# Patient Record
Sex: Female | Born: 1995 | Hispanic: Yes | Smoking: Never smoker
Health system: Southern US, Community
[De-identification: ages and names within clinical notes are randomized; demographics above are authoritative.]

## PROBLEM LIST (undated history)

## (undated) ENCOUNTER — Inpatient Hospital Stay (HOSPITAL_COMMUNITY): Payer: Self-pay

## (undated) DIAGNOSIS — F32A Depression, unspecified: Secondary | ICD-10-CM

## (undated) DIAGNOSIS — F419 Anxiety disorder, unspecified: Secondary | ICD-10-CM

## (undated) DIAGNOSIS — F329 Major depressive disorder, single episode, unspecified: Secondary | ICD-10-CM

## (undated) HISTORY — DX: Anxiety disorder, unspecified: F41.9

## (undated) HISTORY — DX: Depression, unspecified: F32.A

## (undated) HISTORY — DX: Major depressive disorder, single episode, unspecified: F32.9

## (undated) HISTORY — PX: NO PAST SURGERIES: SHX2092

---

## 2017-11-17 ENCOUNTER — Encounter (HOSPITAL_COMMUNITY): Payer: Self-pay

## 2017-11-17 ENCOUNTER — Emergency Department (HOSPITAL_COMMUNITY): Payer: Self-pay

## 2017-11-17 DIAGNOSIS — R1110 Vomiting, unspecified: Secondary | ICD-10-CM | POA: Insufficient documentation

## 2017-11-17 DIAGNOSIS — R079 Chest pain, unspecified: Secondary | ICD-10-CM | POA: Insufficient documentation

## 2017-11-17 DIAGNOSIS — R509 Fever, unspecified: Secondary | ICD-10-CM | POA: Insufficient documentation

## 2017-11-17 DIAGNOSIS — R05 Cough: Secondary | ICD-10-CM | POA: Insufficient documentation

## 2017-11-17 DIAGNOSIS — R42 Dizziness and giddiness: Secondary | ICD-10-CM | POA: Insufficient documentation

## 2017-11-17 DIAGNOSIS — Z5321 Procedure and treatment not carried out due to patient leaving prior to being seen by health care provider: Secondary | ICD-10-CM | POA: Insufficient documentation

## 2017-11-17 LAB — BASIC METABOLIC PANEL
ANION GAP: 10 (ref 5–15)
BUN: 9 mg/dL (ref 6–20)
CHLORIDE: 107 mmol/L (ref 101–111)
CO2: 22 mmol/L (ref 22–32)
Calcium: 9.3 mg/dL (ref 8.9–10.3)
Creatinine, Ser: 0.62 mg/dL (ref 0.44–1.00)
GFR calc Af Amer: >60 mL/min (ref >60)
GFR calc non Af Amer: >60 mL/min (ref >60)
Glucose, Bld: 97 mg/dL (ref 65–99)
POTASSIUM: 3.4 mmol/L — AB (ref 3.5–5.1)
SODIUM: 139 mmol/L (ref 135–145)

## 2017-11-17 LAB — CBC
HEMATOCRIT: 41.7 % (ref 36.0–46.0)
HEMOGLOBIN: 13.8 g/dL (ref 12.0–15.0)
MCH: 29.9 pg (ref 26.0–34.0)
MCHC: 33.1 g/dL (ref 30.0–36.0)
MCV: 90.5 fL (ref 78.0–100.0)
Platelets: 289 10*3/uL (ref 150–400)
RBC: 4.61 MIL/uL (ref 3.87–5.11)
RDW: 13.3 % (ref 11.5–15.5)
WBC: 7.3 10*3/uL (ref 4.0–10.5)

## 2017-11-17 LAB — URINALYSIS, ROUTINE W REFLEX MICROSCOPIC
BACTERIA UA: NONE SEEN
BILIRUBIN URINE: NEGATIVE
Glucose, UA: NEGATIVE mg/dL
HGB URINE DIPSTICK: NEGATIVE
KETONES UR: NEGATIVE mg/dL
Nitrite: NEGATIVE
PROTEIN: NEGATIVE mg/dL
SPECIFIC GRAVITY, URINE: 1.024 (ref 1.005–1.030)
pH: 5 (ref 5.0–8.0)

## 2017-11-17 LAB — I-STAT BETA HCG BLOOD, ED (MC, WL, AP ONLY)

## 2017-11-17 LAB — I-STAT TROPONIN, ED: Troponin i, poc: 0 ng/mL (ref 0.00–0.08)

## 2017-11-17 NOTE — ED Triage Notes (Signed)
Pt reports dizziness for the past several days, hot and cold chills, fevers, vomiting x one, and dry cough, chest tightness with coughing

## 2017-11-18 ENCOUNTER — Emergency Department (HOSPITAL_COMMUNITY)
Admission: EM | Admit: 2017-11-18 | Discharge: 2017-11-18 | Disposition: A | Discharge status: Home / Self Care | Payer: Self-pay | Attending: Emergency Medicine | Admitting: Emergency Medicine

## 2017-11-18 NOTE — ED Notes (Addendum)
Patient was called to reassess vital signs patient did not answer.

## 2018-01-29 ENCOUNTER — Ambulatory Visit: Payer: Self-pay | Admitting: Family Medicine

## 2018-01-29 DIAGNOSIS — Z0289 Encounter for other administrative examinations: Secondary | ICD-10-CM

## 2018-02-05 ENCOUNTER — Ambulatory Visit: Payer: 59 | Admitting: Family Medicine

## 2018-02-05 ENCOUNTER — Encounter: Payer: Self-pay | Admitting: Family Medicine

## 2018-02-05 VITALS — BP 96/74 | HR 78 | Temp 97.9°F | Ht 62.0 in | Wt 135.0 lb

## 2018-02-05 DIAGNOSIS — Z7251 High risk heterosexual behavior: Secondary | ICD-10-CM

## 2018-02-05 DIAGNOSIS — Z7689 Persons encountering health services in other specified circumstances: Secondary | ICD-10-CM

## 2018-02-05 DIAGNOSIS — R519 Headache, unspecified: Secondary | ICD-10-CM

## 2018-02-05 DIAGNOSIS — R51 Headache: Secondary | ICD-10-CM | POA: Diagnosis not present

## 2018-02-05 DIAGNOSIS — F329 Major depressive disorder, single episode, unspecified: Secondary | ICD-10-CM

## 2018-02-05 DIAGNOSIS — F32A Depression, unspecified: Secondary | ICD-10-CM

## 2018-02-05 DIAGNOSIS — F419 Anxiety disorder, unspecified: Secondary | ICD-10-CM

## 2018-02-05 LAB — POCT URINE PREGNANCY: PREG TEST UR: NEGATIVE

## 2018-02-05 MED ORDER — SERTRALINE HCL 25 MG PO TABS: ORAL_TABLET | ORAL | 1 refills | Status: DC

## 2018-02-05 NOTE — Progress Notes (Signed)
Patient presents to clinic today to establish care.  SUBJECTIVE: PMH: Patient is a 22 year old female with past medical history significant for depression, and headaches.  Depression/anxiety: -Pt states she is been dealing with this for years. -In the past patient was on citalopram. Pt did not like the way the medication made her feel, so she stopped taking it. -Pt is currently in counseling. -Pt endorses increased tiredness, decreased mood, decreased interest in doing activities. -Pt denies SI/HI. -Pt is interested in restarting medication  Pregnancy concern: -pt endorses concern as her period has been irregular. -pt recently stopped taking Depo provera in April.  She endorses continued bleeding after this for a few weeks, no bleeding x 1 wk, then some light bleeding after that. -pt endorses unprotected sex during this time frame.  HAs: -Patient endorses regular headaches almost daily around lunchtime. -Patient endorses temple and frontal pain accompanied with light sensitivity.  Patient denies aura or blurred vision. -Patient may take Tylenol for this. -Patient drinking maybe 2 bottles of water per day.  Otherwise she is drinking soda at least 3 times per day with each meal.  Allergies: NKDA Lactose intolerance  Past surgical history: With some tooth extraction  Social history: Patient is single.  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  Patient has a high school.  Pt endorses alcohol and marijuana use.  Patient denies tobacco use.   Past Medical History:  Diagnosis Date  . Anxiety   . Depression     History reviewed. No pertinent surgical history.  No current outpatient medications on file prior to visit.   No current facility-administered medications on file prior to visit.     Allergies  Allergen Reactions  . Latex Rash    History reviewed. No pertinent family history.  Social History   Socioeconomic History  .  Marital status: Single    Spouse name: Not on file  . Number of children: Not on file  . Years of education: Not on file  . Highest education level: Not on file  Occupational History  . Not on file  Social Needs  . Financial resource strain: Not on file  . Food insecurity:    Worry: Not on file    Inability: Not on file  . Transportation needs:    Medical: Not on file    Non-medical: Not on file  Tobacco Use  . Smoking status: Never Smoker  . Smokeless tobacco: Never Used  Substance and Sexual Activity  . Alcohol use: Yes    Frequency: Never  . Drug use: Yes    Types: Marijuana  . Sexual activity: Yes  Lifestyle  . Physical activity:    Days per week: Not on file    Minutes per session: Not on file  . Stress: Not on file  Relationships  . Social connections:    Talks on phone: Not on file    Gets together: Not on file    Attends religious service: Not on file    Active member of club or organization: Not on file    Attends meetings of clubs or organizations: Not on file    Relationship status: Not on file  . Intimate partner violence:    Fear of current or ex partner: Not on file    Emotionally abused: Not on file    Physically abused: Not on file    Forced sexual activity: Not on file  Other Topics Concern  . Not  on file  Social History Narrative  . Not on file    ROS General: Denies fever, chills, night sweats, changes in weight, changes in appetite HEENT: Denies headaches, ear pain, changes in vision, rhinorrhea, sore throat CV: Denies CP, palpitations, SOB, orthopnea Pulm: Denies SOB, cough, wheezing GI: Denies abdominal pain, nausea, vomiting, diarrhea, constipation GU: Denies dysuria, hematuria, frequency, vaginal discharge Msk: Denies muscle cramps, joint pains Neuro: Denies weakness, numbness, tingling Skin: Denies rashes, bruising Psych: Denies depression, anxiety, hallucinations  BP 96/74 (BP Location: Left Arm, Patient Position: Sitting, Cuff  Size: Normal)   Pulse 78   Temp 97.9 F (36.6 C) (Oral)   Ht 5' 2"  (1.575 m)   Wt 135 lb (61.2 kg)   SpO2 98%   BMI 24.69 kg/m   Physical Exam Gen. Pleasant, well developed, well-nourished, in NAD HEENT - [place]/AT, PERRL, no scleral icterus, no nasal drainage, pharynx without erythema or exudate.  TMs normal bilaterally.  No cervical lymphadenopathy. Lungs: no use of accessory muscles, CTAB, no wheezes, rales or rhonchi Cardiovascular: RRR, No r/g/m, no peripheral edema Abdomen: BS present, soft, nontender, nondistended Musculoskeletal: No deformities, moves all four extremities, no cyanosis or clubbing, normal tone Neuro:  A&Ox3, CN II-XII intact, normal gait Skin:  Warm, dry, intact, no lesions Psych: normal affect, mood depressed.  Recent Results (from the past 2160 hour(s))  Basic metabolic panel     Status: Abnormal   Collection Time: 11/17/17  9:08 PM  Result Value Ref Range   Sodium 139 135 - 145 mmol/L   Potassium 3.4 (L) 3.5 - 5.1 mmol/L   Chloride 107 101 - 111 mmol/L   CO2 22 22 - 32 mmol/L   Glucose, Bld 97 65 - 99 mg/dL   BUN 9 6 - 20 mg/dL   Creatinine, Ser 0.62 0.44 - 1.00 mg/dL   Calcium 9.3 8.9 - 10.3 mg/dL   GFR calc non Af Amer >60 >60 mL/min   GFR calc Af Amer >60 >60 mL/min    Comment: (NOTE) The eGFR has been calculated using the CKD EPI equation. This calculation has not been validated in all clinical situations. eGFR's persistently <60 mL/min signify possible Chronic Kidney Disease.    Anion gap 10 5 - 15    Comment: Performed at [facility] [address] [place] [address]  CBC     Status: None   Collection Time: 11/17/17  9:08 PM  Result Value Ref Range   WBC 7.3 4.0 - 10.5 K/uL   RBC 4.61 3.87 - 5.11 MIL/uL   Hemoglobin 13.8 12.0 - 15.0 g/dL   HCT 41.7 36.0 - 46.0 %   MCV 90.5 78.0 - 100.0 fL   MCH 29.9 26.0 - 34.0 pg   MCHC 33.1 30.0 - 36.0 g/dL   RDW 13.3 11.5 - 15.5 %   Platelets 289 150 - 400 K/uL    Comment:  Performed at [facility] [address], [place], [place] [address]  Urinalysis, Routine w reflex microscopic     Status: Abnormal   Collection Time: 11/17/17  9:20 PM  Result Value Ref Range   Color, Urine YELLOW YELLOW   APPearance HAZY (A) CLEAR   Specific Gravity, Urine 1.024 1.005 - 1.030   pH 5.0 5.0 - 8.0   Glucose, UA NEGATIVE NEGATIVE mg/dL   Hgb urine dipstick NEGATIVE NEGATIVE   Bilirubin Urine NEGATIVE NEGATIVE   Ketones, ur NEGATIVE NEGATIVE mg/dL   Protein, ur NEGATIVE NEGATIVE mg/dL  Nitrite NEGATIVE NEGATIVE   Leukocytes, UA TRACE (A) NEGATIVE   RBC / HPF 0-5 0 - 5 RBC/hpf   WBC, UA 0-5 0 - 5 WBC/hpf   Bacteria, UA NONE SEEN NONE SEEN   Squamous Epithelial / LPF 0-5 (A) NONE SEEN   Mucus PRESENT     Comment: Performed at [facility], [place] [address], [place], [place] [address]  I-Stat beta hCG blood, ED     Status: None   Collection Time: 11/17/17  9:39 PM  Result Value Ref Range   I-stat hCG, quantitative <5.0 <5 mIU/mL   Comment 3            Comment:   GEST. AGE      CONC.  (mIU/mL)   <=1 WEEK        5 - 50     2 WEEKS       50 - 500     3 WEEKS       100 - 10,000     4 WEEKS     1,000 - 30,000        FEMALE AND NON-PREGNANT FEMALE:     LESS THAN 5 mIU/mL   I-Stat Troponin, ED (not at [facility])     Status: None   Collection Time: 11/17/17  9:39 PM  Result Value Ref Range   Troponin i, poc 0.00 0.00 - 0.08 ng/mL   Comment 3            Comment: Due to the release kinetics of cTnI, a negative result within the first hours of the onset of symptoms does not rule out myocardial infarction with certainty. If myocardial infarction is still suspected, repeat the test at appropriate intervals.     Assessment/Plan: Anxiety and depression  -PHQ 9 score 23 -Gad 7 score 21 -Encouraged to continue counseling - Plan: sertraline (ZOLOFT) 25 MG tablet.  Patient to take 25 mg daily x1 week then may increase to 50 mg -Use of protection during sex advised.   Pt to consider birth control options -Follow-up in 6 weeks, sooner if needed  Frequent headaches -decrease caffeine intake, increase po intake of water -decrease intake of alcohol and marijuana use -get plenty of rest  -given handout  Encounter to establish care -We reviewed the PMH, PSH, FH, SH, Meds and Allergies. -We provided refills for any medications we will prescribe as needed. -We addressed current concerns per orders and patient instructions. -We have asked for records for pertinent exams, studies, vaccines and notes from previous providers. -We have advised patient to follow up per instructions below.  Unprotected sex  - Plan: POCT urine pregnancy  negative -Discussed birth control options.  Patient to decide.  Advised to use condoms in the meantime.  Grier Mitts, MD

## 2018-02-05 NOTE — Patient Instructions (Addendum)
Living With Depression Everyone experiences occasional disappointment, sadness, and loss in their lives. When you are feeling down, blue, or sad for at least 2 weeks in a row, it may mean that you have depression. Depression can affect your thoughts and feelings, relationships, daily activities, and physical health. It is caused by changes in the way your brain functions. If you receive a diagnosis of depression, your health care provider will tell you which type of depression you have and what treatment options are available to you. If you are living with depression, there are ways to help you recover from it and also ways to prevent it from coming back. How to cope with lifestyle changes Coping with stress Stress is your body's reaction to life changes and events, both good and bad. Stressful situations may include:  Getting married.  The death of a spouse.  Losing a job.  Retiring.  Having a baby.  Stress can last just a few hours or it can be ongoing. Stress can play a major role in depression, so it is important to learn both how to cope with stress and how to think about it differently. Talk with your health care provider or a counselor if you would like to learn more about stress reduction. He or she may suggest some stress reduction techniques, such as:  Music therapy. This can include creating music or listening to music. Choose music that you enjoy and that inspires you.  Mindfulness-based meditation. This kind of meditation can be done while sitting or walking. It involves being aware of your normal breaths, rather than trying to control your breathing.  Centering prayer. This is a kind of meditation that involves focusing on a spiritual word or phrase. Choose a word, phrase, or sacred image that is meaningful to you and that brings you peace.  Deep breathing. To do this, expand your stomach and inhale slowly through your nose. Hold your breath for 3-5 seconds, then exhale  slowly, allowing your stomach muscles to relax.  Muscle relaxation. This involves intentionally tensing muscles then relaxing them.  Choose a stress reduction technique that fits your lifestyle and personality. Stress reduction techniques take time and practice to develop. Set aside 5-15 minutes a day to do them. Therapists can offer training in these techniques. The training may be covered by some insurance plans. Other things you can do to manage stress include:  Keeping a stress diary. This can help you learn what triggers your stress and ways to control your response.  Understanding what your limits are and saying no to requests or events that lead to a schedule that is too full.  Thinking about how you respond to certain situations. You may not be able to control everything, but you can control how you react.  Adding humor to your life by watching funny films or TV shows.  Making time for activities that help you relax and not feeling guilty about spending your time this way.  Medicines Your health care provider may suggest certain medicines if he or she feels that they will help improve your condition. Avoid using alcohol and other substances that may prevent your medicines from working properly (may interact). It is also important to:  Talk with your pharmacist or health care provider about all the medicines that you take, their possible side effects, and what medicines are safe to take together.  Make it your goal to take part in all treatment decisions (shared decision-making). This includes giving input on the side   effects of medicines. It is best if shared decision-making with your health care provider is part of your total treatment plan.  If your health care provider prescribes a medicine, you may not notice the full benefits of it for 4-8 weeks. Most people who are treated for depression need to be on medicine for at least 6-12 months after they feel better. If you are taking  medicines as part of your treatment, do not stop taking medicines without first talking to your health care provider. You may need to have the medicine slowly decreased (tapered) over time to decrease the risk of harmful side effects. Relationships Your health care provider may suggest family therapy along with individual therapy and drug therapy. While there may not be family problems that are causing you to feel depressed, it is still important to make sure your family learns as much as they can about your mental health. Having your family's support can help make your treatment successful. How to recognize changes in your condition Everyone has a different response to treatment for depression. Recovery from major depression happens when you have not had signs of major depression for two months. This may mean that you will start to:  Have more interest in doing activities.  Feel less hopeless than you did 2 months ago.  Have more energy.  Overeat less often, or have better or improving appetite.  Have better concentration.  Your health care provider will work with you to decide the next steps in your recovery. It is also important to recognize when your condition is getting worse. Watch for these signs:  Having fatigue or low energy.  Eating too much or too little.  Sleeping too much or too little.  Feeling restless, agitated, or hopeless.  Having trouble concentrating or making decisions.  Having unexplained physical complaints.  Feeling irritable, angry, or aggressive.  Get help as soon as you or your family members notice these symptoms coming back. How to get support and help from others How to talk with friends and family members about your condition Talking to friends and family members about your condition can provide you with one way to get support and guidance. Reach out to trusted friends or family members, explain your symptoms to them, and let them know that you are  working with a health care provider to treat your depression. Financial resources Not all insurance plans cover mental health care, so it is important to check with your insurance carrier. If paying for co-pays or counseling services is a problem, search for a local or county mental health care center. They may be able to offer public mental health care services at low or no cost when you are not able to see a private health care provider. If you are taking medicine for depression, you may be able to get the generic form, which may be less expensive. Some makers of prescription medicines also offer help to patients who cannot afford the medicines they need. Follow these instructions at home:  Get the right amount and quality of sleep.  Cut down on using caffeine, tobacco, alcohol, and other potentially harmful substances.  Try to exercise, such as walking or lifting small weights.  Take over-the-counter and prescription medicines only as told by your health care provider.  Eat a healthy diet that includes plenty of vegetables, fruits, whole grains, low-fat dairy products, and lean protein. Do not eat a lot of foods that are high in solid fats, added sugars, or salt.    Keep all follow-up visits as told by your health care provider. This is important. Contact a health care provider if:  You stop taking your antidepressant medicines, and you have any of these symptoms: ? Nausea. ? Headache. ? Feeling lightheaded. ? Chills and body aches. ? Not being able to sleep (insomnia).  You or your friends and family think your depression is getting worse. Get help right away if:  You have thoughts of hurting yourself or others. If you ever feel like you may hurt yourself or others, or have thoughts about taking your own life, get help right away. You can go to your nearest emergency department or [place]:  Your local emergency services (911 in the [place]  A suicide crisis helpline, such as the  [place] at 603-133-2373. This is open 24-hours a day.  Summary  If you are living with depression, there are ways to help you recover from it and also ways to prevent it from coming back.  Work with your health care team to create a management plan that includes counseling, stress management techniques, and healthy lifestyle habits. This information is not intended to replace advice given to you by your health care provider. Make sure you discuss any questions you have with your health care provider. Document Released: 08/19/2016 Document Revised: 08/19/2016 Document Reviewed: 08/19/2016 Elsevier Interactive Patient Education  2018 [employer] After being diagnosed with an anxiety disorder, you may be relieved to know why you have felt or behaved a certain way. It is natural to also feel overwhelmed about the treatment ahead and what it will mean for your life. With care and support, you can manage this condition and recover from it. How to cope with anxiety Dealing with stress Stress is your body's reaction to life changes and events, both good and bad. Stress can last just a few hours or it can be ongoing. Stress can play a major role in anxiety, so it is important to learn both how to cope with stress and how to think about it differently. Talk with your health care provider or a counselor to learn more about stress reduction. He or she may suggest some stress reduction techniques, such as:  Music therapy. This can include creating or listening to music that you enjoy and that inspires you.  Mindfulness-based meditation. This involves being aware of your normal breaths, rather than trying to control your breathing. It can be done while sitting or walking.  Centering prayer. This is a kind of meditation that involves focusing on a word, phrase, or sacred image that is meaningful to you and that brings you peace.  Deep breathing. To do  this, expand your stomach and inhale slowly through your nose. Hold your breath for 3-5 seconds. Then exhale slowly, allowing your stomach muscles to relax.  Self-talk. This is a skill where you identify thought patterns that lead to anxiety reactions and correct those thoughts.  Muscle relaxation. This involves tensing muscles then relaxing them.  Choose a stress reduction technique that fits your lifestyle and personality. Stress reduction techniques take time and practice. Set aside 5-15 minutes a day to do them. Therapists can offer training in these techniques. The training may be covered by some insurance plans. Other things you can do to manage stress include:  Keeping a stress diary. This can help you learn what triggers your stress and ways to control your response.  Thinking about how you respond to certain situations. You  may not be able to control everything, but you can control your reaction.  Making time for activities that help you relax, and not feeling guilty about spending your time in this way.  Therapy combined with coping and stress-reduction skills provides the best chance for successful treatment. Medicines Medicines can help ease symptoms. Medicines for anxiety include:  Anti-anxiety drugs.  Antidepressants.  Beta-blockers.  Medicines may be used as the main treatment for anxiety disorder, along with therapy, or if other treatments are not working. Medicines should be prescribed by a health care provider. Relationships Relationships can play a big part in helping you recover. Try to spend more time connecting with trusted friends and family members. Consider going to couples counseling, taking family education classes, or going to family therapy. Therapy can help you and others better understand the condition. How to recognize changes in your condition Everyone has a different response to treatment for anxiety. Recovery from anxiety happens when symptoms decrease  and stop interfering with your daily activities at home or work. This may mean that you will start to:  Have better concentration and focus.  Sleep better.  Be less irritable.  Have more energy.  Have improved memory.  It is important to recognize when your condition is getting worse. Contact your health care provider if your symptoms interfere with home or work and you do not feel like your condition is improving. Where to find help and support: You can get help and support from these sources:  [place]-help groups.  Online and [employer].  A trusted spiritual leader.  Couples counseling.  Family education classes.  Family therapy.  Follow these instructions at home:  Eat a healthy diet that includes plenty of vegetables, fruits, whole grains, low-fat dairy products, and lean protein. Do not eat a lot of foods that are high in solid fats, added sugars, or salt.  Exercise. Most adults should do the following: ? Exercise for at least 150 minutes each week. The exercise should increase your heart rate and make you sweat (moderate-intensity exercise). ? Strengthening exercises at least twice a week.  Cut down on caffeine, tobacco, alcohol, and other potentially harmful substances.  Get the right amount and quality of sleep. Most adults need 7-9 hours of sleep each night.  Make choices that simplify your life.  Take over-the-counter and prescription medicines only as told by your health care provider.  Avoid caffeine, alcohol, and certain over-the-counter cold medicines. These may make you feel worse. Ask your pharmacist which medicines to avoid.  Keep all follow-up visits as told by your health care provider. This is important. Questions to ask your health care provider  Would I benefit from therapy?  How often should I follow up with a health care provider?  How long do I need to take medicine?  Are there any long-term side effects of my medicine?  Are  there any alternatives to taking medicine? Contact a health care provider if:  You have a hard time staying focused or finishing daily tasks.  You spend many hours a day feeling worried about everyday life.  You become exhausted by worry.  You start to have headaches, feel tense, or have nausea.  You urinate more than normal.  You have diarrhea. Get help right away if:  You have a racing heart and shortness of breath.  You have thoughts of hurting yourself or others. If you ever feel like you may hurt yourself or others, or have thoughts about taking your own  life, get help right away. You can go to your nearest emergency department or call:  Your local emergency services (911 in the [place]  A suicide crisis helpline, such as the National Suicide Prevention Lifeline at 6294439191. This is open 24-hours a day.  Summary  Taking steps to deal with stress can help calm you.  Medicines cannot cure anxiety disorders, but they can help ease symptoms.  Family, friends, and partners can play a big part in helping you recover from an anxiety disorder. This information is not intended to replace advice given to you by your health care provider. Make sure you discuss any questions you have with your health care provider. Document Released: 09/10/2016 Document Revised: 09/10/2016 Document Reviewed: 09/10/2016 Elsevier Interactive Patient Education  2018 [employer].  Contraception Choices Contraception, also called birth control, means things to use or ways to try not to get pregnant. Hormonal birth control This kind of birth control uses hormones. Here are some types of hormonal birth control:  A tube that is put under skin of the arm (implant). The tube can stay in for as long as 3 years.  Shots to get every 3 months (injections).  Pills to take every day (birth control pills).  A patch to change 1 time each week for 3 weeks (birth control patch). After that, the patch is  taken off for 1 week.  A ring to put in the vagina. The ring is left in for 3 weeks. Then it is taken out of the vagina for 1 week. Then a new ring is put in.  Pills to take after unprotected sex (emergency birth control pills).  Barrier birth control Here are some types of barrier birth control:  A thin covering that is put on the penis before sex (female condom). The covering is thrown away after sex.  A soft, loose covering that is put in the vagina before sex (female condom). The covering is thrown away after sex.  A rubber bowl that sits over the cervix (diaphragm). The bowl must be made for you. The bowl is put into the vagina before sex. The bowl is left in for 6-8 hours after sex. It is taken out within 24 hours.  A small, soft cup that fits over the cervix (cervical cap). The cup must be made for you. The cup can be left in for 6-8 hours after sex. It is taken out within 48 hours.  A sponge that is put into the vagina before sex. It must be left in for at least 6 hours after sex. It must be taken out within 30 hours. Then it is thrown away.  A chemical that kills or stops sperm from getting into the uterus (spermicide). It may be a pill, cream, jelly, or foam to put in the vagina. The chemical should be used at least 10-15 minutes before sex.  IUD (intrauterine) birth control An IUD is a small, T-shaped piece of plastic. It is put inside the uterus. There are two kinds:  Hormone IUD. This kind can stay in for 3-5 years.  Copper IUD. This kind can stay in for 10 years.  Permanent birth control Here are some types of permanent birth control:  Surgery to block the fallopian tubes.  Having an insert put into each fallopian tube.  Surgery to tie off the tubes that carry sperm (vasectomy).  Natural planning birth control Here are some types of natural planning birth control:  Not having sex on the days the woman  could get pregnant.  Using a calendar: ? To keep track of the  length of each period. ? To find out what days pregnancy can happen. ? To plan to not have sex on days when pregnancy can happen.  Watching for symptoms of ovulation and not having sex during ovulation. One way the woman can check for ovulation is to check her temperature.  Waiting to have sex until after ovulation.  Summary  Contraception, also called birth control, means things to use or ways to try not to get pregnant.  Hormonal methods of birth control include implants, injections, pills, patches, vaginal rings, and emergency birth control pills.  Barrier methods of birth control can include female condoms, female condoms, diaphragms, cervical caps, sponges, and spermicides.  There are two types of IUD (intrauterine device) birth control. An IUD can be put in a woman's uterus to prevent pregnancy for 3-5 years.  Permanent sterilization can be done through a procedure for males, females, or both.  Natural planning methods involve not having sex on the days when the woman could get pregnant. This information is not intended to replace advice given to you by your health care provider. Make sure you discuss any questions you have with your health care provider. Document Released: 07/14/2009 Document Revised: 09/26/2016 Document Reviewed: 09/26/2016 Elsevier Interactive Patient Education  2017 [employer].

## 2018-02-07 ENCOUNTER — Encounter: Payer: Self-pay | Admitting: Family Medicine

## 2018-02-09 ENCOUNTER — Ambulatory Visit: Payer: 59 | Admitting: Family Medicine

## 2018-02-09 ENCOUNTER — Encounter: Payer: Self-pay | Admitting: Family Medicine

## 2018-02-09 ENCOUNTER — Other Ambulatory Visit: Payer: Self-pay

## 2018-02-09 VITALS — BP 104/66 | HR 99 | Temp 98.0°F | Resp 17 | Ht 62.0 in | Wt 138.2 lb

## 2018-02-09 DIAGNOSIS — R1084 Generalized abdominal pain: Secondary | ICD-10-CM | POA: Diagnosis not present

## 2018-02-09 DIAGNOSIS — M533 Sacrococcygeal disorders, not elsewhere classified: Secondary | ICD-10-CM | POA: Diagnosis not present

## 2018-02-09 MED ORDER — PANTOPRAZOLE SODIUM 40 MG PO TBEC: 40.0000 mg | DELAYED_RELEASE_TABLET | Freq: Every day | ORAL | 3 refills | Status: DC

## 2018-02-09 NOTE — Progress Notes (Signed)
Subjective:    Patient ID: Sonia Miller, female    DOB: Mar 05, 1996, 22 y.o.   MRN: [id number]  Chief Complaint  Patient presents with  . Abdominal Pain    Painful for about a week, getting worse, severe pain when laying on side or stomach, nausea, sharp pain when walking on right side, worse in afternoon    HPI Patient was seen today for ongoing concern.  Pt endorses abdominal pain with nausea over the last 2 weeks.  Pt cannot associate the pain with specific foods but does notice it when she eats or drinks anything.  Pt also has a feeling of "my organs moving to one side" when she lays on her side at night. Pt also endorses feeling bloated.  Pt endorses occasional burning sensation but denies heartburn.  Pt is having a bowel movement every day, sometimes twice a day.  She denies having to strain.  Pt also endorses a pain in her buttock with prolonged sitting.  Pt states this is become worse over the last month.  Pt does not recall any injury.  The pain is noted as a sharp sensation that is often worse patient has simultaneous abdominal pain.  Pt has tried Tylenol with no relief.  Since last OFV pt has not started the Zoloft as she became anxious thinking about possible side effects.  Past Medical History:  Diagnosis Date  . Anxiety   . Depression     Allergies  Allergen Reactions  . Latex Rash    ROS General: Denies fever, chills, night sweats, changes in weight, changes in appetite HEENT: Denies headaches, ear pain, changes in vision, rhinorrhea, sore throat CV: Denies CP, palpitations, SOB, orthopnea Pulm: Denies SOB, cough, wheezing GI: Denies vomiting, diarrhea, constipation   + abdominal pain, bloating, nausea GU: Denies dysuria, hematuria, frequency, vaginal discharge Msk: Denies muscle cramps, joint pains + back pain Neuro: Denies weakness, numbness, tingling Skin: Denies rashes, bruising Psych: Denies depression, anxiety, hallucinations     Objective:     Blood pressure 104/66, pulse 99, temperature 98 F (36.7 C), temperature source Oral, resp. rate 17, height  (1.575 m), weight 138 lb 3.2 oz (62.7 kg), SpO2 99 %.   Gen. Pleasant, well-nourished, in no distress, normal affect   HEENT: [place]/AT, face symmetric, no scleral icterus, PERRLA, nares patent without drainage, pharynx without erythema or exudate. Lungs: no accessory muscle use, CTAB, no wheezes or rales Cardiovascular: RRR, no m/r/g, no peripheral edema Abdomen: BS present, soft, diffuse TTP greater in RUQ. ND, no hepatosplenomegaly. Musculoskeletal: No deformities, no cyanosis or clubbing, normal tone   TTP of midline sacral area. Neuro:  A&Ox3, CN II-XII intact, normal gait   Wt Readings from Last 3 Encounters:  02/09/18 138 lb 3.2 oz (62.7 kg)  02/05/18 135 lb (61.2 kg)  11/17/17 130 lb (59 kg)    Lab Results  Component Value Date   WBC 7.3 11/17/2017   HGB 13.8 11/17/2017   HCT 41.7 11/17/2017   PLT 289 11/17/2017   GLUCOSE 97 11/17/2017   NA 139 11/17/2017   K 3.4 (L) 11/17/2017   CL 107 11/17/2017   CREATININE 0.62 11/17/2017   BUN 9 11/17/2017   CO2 22 11/17/2017    Assessment/Plan:  Generalized abdominal pain  -Discussed possible causes -We will start PPI daily and obtain labs. -If symptoms continue consider imaging and GI referral. -UA negative.  SG 1.025 -Urine hCG negative at last visit: 02/05/2018 -Patient encouraged to cut down on daily  marijuana use. - Plan: Comprehensive metabolic panel, Lipase, Amylase, CBC with Differential/Platelet, pantoprazole (PROTONIX) 40 MG tablet  Sacral back pain -Discussed massage, stretching, heat or ice. -Okay to take Tylenol as needed  Follow-up PRN in 1 month, sooner if needed  Abbe [place], MD

## 2018-02-09 NOTE — Patient Instructions (Signed)
Abdominal Bloating When you have abdominal bloating, your abdomen may feel full, tight, or painful. It may also look bigger than normal or swollen (distended). Common causes of abdominal bloating include:  Swallowing air.  Constipation.  Problems digesting food.  Eating too much.  Irritable bowel syndrome. This is a condition that affects the large intestine.  Lactose intolerance. This is an inability to digest lactose, a natural sugar in dairy products.  Celiac disease. This is a condition that affects the ability to digest gluten, a protein found in some grains.  Gastroparesis. This is a condition that slows down the movement of food in the stomach and small intestine. It is more common in people with diabetes mellitus.  Gastroesophageal reflux disease (GERD). This is a digestive condition that makes stomach acid flow back into the esophagus.  Urinary retention. This means that the body is holding onto urine, and the bladder cannot be emptied all the way.  Follow these instructions at home: Eating and drinking  Avoid eating too much.  Try not to swallow air while talking or eating.  Avoid eating while lying down.  Avoid these foods and drinks: ? Foods that cause gas, such as broccoli, cabbage, cauliflower, and baked beans. ? Carbonated drinks. ? Hard candy. ? Chewing gum. Medicines  Take over-the-counter and prescription medicines only as told by your health care provider.  Take probiotic medicines. These medicines contain live bacteria or yeasts that can help digestion.  Take coated peppermint oil capsules. Activity  Try to exercise regularly. Exercise may help to relieve bloating that is caused by gas and relieve constipation. General instructions  Keep all follow-up visits as told by your health care provider. This is important. Contact a health care provider if:  You have nausea and vomiting.  You have diarrhea.  You have abdominal pain.  You have  unusual weight loss or weight gain.  You have severe pain, and medicines do not help. Get help right away if:  You have severe chest pain.  You have trouble breathing.  You have shortness of breath.  You have trouble urinating.  You have darker urine than normal.  You have blood in your stools or have dark, tarry stools. Summary  Abdominal bloating means that the abdomen is swollen.  Common causes of abdominal bloating are swallowing air, constipation, and problems digesting food.  Avoid eating too much and avoid swallowing air.  Avoid foods that cause gas, carbonated drinks, hard candy, and chewing gum. This information is not intended to replace advice given to you by your health care provider. Make sure you discuss any questions you have with your health care provider. Document Released: 10/18/2016 Document Revised: 10/18/2016 Document Reviewed: 10/18/2016 Elsevier Interactive Patient Education  2018 Elsevier Inc.  Abdominal Pain, Adult Many things can cause belly (abdominal) pain. Most times, belly pain is not dangerous. Many cases of belly pain can be watched and treated at home. Sometimes belly pain is serious, though. Your doctor will try to find the cause of your belly pain. Follow these instructions at home:  Take over-the-counter and prescription medicines only as told by your doctor. Do not take medicines that help you poop (laxatives) unless told to by your doctor.  Drink enough fluid to keep your pee (urine) clear or pale yellow.  Watch your belly pain for any changes.  Keep all follow-up visits as told by your doctor. This is important. Contact a doctor if:  Your belly pain changes or gets worse.  You are  not hungry, or you lose weight without trying.  You are having trouble pooping (constipated) or have watery poop (diarrhea) for more than 2-3 days.  You have pain when you pee or poop.  Your belly pain wakes you up at night.  Your pain gets worse  with meals, after eating, or with certain foods.  You are throwing up and cannot keep anything down.  You have a fever. Get help right away if:  Your pain does not go away as soon as your doctor says it should.  You cannot stop throwing up.  Your pain is only in areas of your belly, such as the right side or the left lower part of the belly.  You have bloody or black poop, or poop that looks like tar.  You have very bad pain, cramping, or bloating in your belly.  You have signs of not having enough fluid or water in your body (dehydration), such as: ? Dark pee, very little pee, or no pee. ? Cracked lips. ? Dry mouth. ? Sunken eyes. ? Sleepiness. ? Weakness. This information is not intended to replace advice given to you by your health care provider. Make sure you discuss any questions you have with your health care provider. Document Released: 03/04/2008 Document Revised: 04/05/2016 Document Reviewed: 02/28/2016 Elsevier Interactive Patient Education  2018 [employer].

## 2018-02-10 LAB — CBC WITH DIFFERENTIAL/PLATELET
BASOS ABS: 0.1 10*3/uL (ref 0.0–0.1)
Basophils Relative: 0.9 % (ref 0.0–3.0)
Eosinophils Absolute: 0.2 10*3/uL (ref 0.0–0.7)
Eosinophils Relative: 2.7 % (ref 0.0–5.0)
HEMATOCRIT: 39.1 % (ref 36.0–46.0)
HEMOGLOBIN: 13.3 g/dL (ref 12.0–15.0)
LYMPHS PCT: 37.1 % (ref 12.0–46.0)
Lymphs Abs: 2.5 10*3/uL (ref 0.7–4.0)
MCHC: 34 g/dL (ref 30.0–36.0)
MCV: 89.8 fl (ref 78.0–100.0)
MONOS PCT: 6.4 % (ref 3.0–12.0)
Monocytes Absolute: 0.4 10*3/uL (ref 0.1–1.0)
NEUTROS ABS: 3.6 10*3/uL (ref 1.4–7.7)
Neutrophils Relative %: 52.9 % (ref 43.0–77.0)
PLATELETS: 259 10*3/uL (ref 150.0–400.0)
RBC: 4.36 Mil/uL (ref 3.87–5.11)
RDW: 12.9 % (ref 11.5–14.6)
WBC: 6.8 10*3/uL (ref 4.5–10.5)

## 2018-02-10 LAB — COMPREHENSIVE METABOLIC PANEL
ALBUMIN: 4.1 g/dL (ref 3.5–5.2)
ALT: 17 U/L (ref 0–35)
AST: 17 U/L (ref 0–37)
Alkaline Phosphatase: 65 U/L (ref 39–117)
BUN: 10 mg/dL (ref 6–23)
CHLORIDE: 105 meq/L (ref 96–112)
CO2: 28 mEq/L (ref 19–32)
Calcium: 9.3 mg/dL (ref 8.4–10.5)
Creatinine, Ser: 0.62 mg/dL (ref 0.40–1.20)
GFR: 129.21 mL/min (ref >60.00)
Glucose, Bld: 91 mg/dL (ref 70–99)
POTASSIUM: 3.7 meq/L (ref 3.5–5.1)
SODIUM: 141 meq/L (ref 135–145)
Total Bilirubin: 0.3 mg/dL (ref 0.2–1.2)
Total Protein: 6.9 g/dL (ref 6.0–8.3)

## 2018-02-10 LAB — LIPASE: LIPASE: 13 U/L (ref 11.0–59.0)

## 2018-02-10 LAB — AMYLASE: Amylase: 51 U/L (ref 27–131)

## 2018-03-04 ENCOUNTER — Ambulatory Visit: Payer: 59 | Admitting: Family Medicine

## 2018-03-04 DIAGNOSIS — Z0289 Encounter for other administrative examinations: Secondary | ICD-10-CM

## 2018-03-08 ENCOUNTER — Encounter: Payer: Self-pay | Admitting: Family Medicine

## 2018-03-09 ENCOUNTER — Other Ambulatory Visit: Payer: Self-pay | Admitting: Family Medicine

## 2018-03-09 DIAGNOSIS — R1084 Generalized abdominal pain: Secondary | ICD-10-CM

## 2018-03-09 DIAGNOSIS — R110 Nausea: Secondary | ICD-10-CM

## 2018-03-09 MED ORDER — PANTOPRAZOLE SODIUM 40 MG PO TBEC: 40.0000 mg | DELAYED_RELEASE_TABLET | Freq: Two times a day (BID) | ORAL | 3 refills | Status: DC

## 2018-03-10 DIAGNOSIS — R109 Unspecified abdominal pain: Secondary | ICD-10-CM | POA: Diagnosis not present

## 2018-03-10 DIAGNOSIS — R6881 Early satiety: Secondary | ICD-10-CM | POA: Diagnosis not present

## 2018-05-05 DIAGNOSIS — B373 Candidiasis of vulva and vagina: Secondary | ICD-10-CM | POA: Diagnosis not present

## 2018-05-05 DIAGNOSIS — R350 Frequency of micturition: Secondary | ICD-10-CM | POA: Diagnosis not present

## 2018-06-12 DIAGNOSIS — R110 Nausea: Secondary | ICD-10-CM | POA: Diagnosis not present

## 2018-06-30 ENCOUNTER — Ambulatory Visit (HOSPITAL_COMMUNITY)
Admission: EM | Admit: 2018-06-30 | Discharge: 2018-06-30 | Disposition: A | Discharge status: Home / Self Care | Payer: 59 | Attending: Family Medicine | Admitting: Family Medicine

## 2018-06-30 ENCOUNTER — Other Ambulatory Visit: Payer: Self-pay

## 2018-06-30 ENCOUNTER — Encounter (HOSPITAL_COMMUNITY): Payer: Self-pay | Admitting: Emergency Medicine

## 2018-06-30 DIAGNOSIS — R102 Pelvic and perineal pain: Secondary | ICD-10-CM

## 2018-06-30 LAB — POCT URINALYSIS DIP (DEVICE)
Bilirubin Urine: NEGATIVE
GLUCOSE, UA: NEGATIVE mg/dL
Ketones, ur: NEGATIVE mg/dL
LEUKOCYTES UA: NEGATIVE
NITRITE: NEGATIVE
PROTEIN: 30 mg/dL — AB
SPECIFIC GRAVITY, URINE: 1.025 (ref 1.005–1.030)
UROBILINOGEN UA: 1 mg/dL (ref 0.0–1.0)
pH: 7 (ref 5.0–8.0)

## 2018-06-30 LAB — POCT PREGNANCY, URINE: Preg Test, Ur: NEGATIVE

## 2018-06-30 MED ORDER — NAPROXEN 500 MG PO TABS: 500.0000 mg | ORAL_TABLET | Freq: Two times a day (BID) | ORAL | 0 refills | Status: DC
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
## 2018-06-30 NOTE — Discharge Instructions (Addendum)
I believe you are just having a late menstrual period.  Your symptoms should be controlled with the Naprosyn that I prescribed.  If you continue having problems, please return

## 2018-08-14 ENCOUNTER — Encounter (HOSPITAL_COMMUNITY): Payer: Self-pay | Admitting: *Deleted

## 2018-08-14 ENCOUNTER — Encounter (HOSPITAL_COMMUNITY): Payer: Self-pay | Admitting: Emergency Medicine

## 2018-08-14 ENCOUNTER — Other Ambulatory Visit: Payer: Self-pay

## 2018-08-14 ENCOUNTER — Inpatient Hospital Stay (HOSPITAL_COMMUNITY): Payer: 59

## 2018-08-14 ENCOUNTER — Ambulatory Visit (HOSPITAL_COMMUNITY)
Admission: EM | Admit: 2018-08-14 | Discharge: 2018-08-14 | Disposition: A | Discharge status: Home / Self Care | Payer: 59 | Source: Home / Self Care

## 2018-08-14 ENCOUNTER — Inpatient Hospital Stay (HOSPITAL_COMMUNITY)
Admission: AD | Admit: 2018-08-14 | Discharge: 2018-08-14 | Disposition: A | Discharge status: Home / Self Care | Payer: 59 | Source: Ambulatory Visit | Attending: Obstetrics & Gynecology | Admitting: Obstetrics & Gynecology

## 2018-08-14 DIAGNOSIS — O219 Vomiting of pregnancy, unspecified: Secondary | ICD-10-CM | POA: Diagnosis not present

## 2018-08-14 DIAGNOSIS — Z3A01 Less than 8 weeks gestation of pregnancy: Secondary | ICD-10-CM | POA: Diagnosis not present

## 2018-08-14 DIAGNOSIS — O26899 Other specified pregnancy related conditions, unspecified trimester: Secondary | ICD-10-CM

## 2018-08-14 DIAGNOSIS — Z3490 Encounter for supervision of normal pregnancy, unspecified, unspecified trimester: Secondary | ICD-10-CM

## 2018-08-14 DIAGNOSIS — F419 Anxiety disorder, unspecified: Secondary | ICD-10-CM | POA: Diagnosis not present

## 2018-08-14 DIAGNOSIS — Z791 Long term (current) use of non-steroidal anti-inflammatories (NSAID): Secondary | ICD-10-CM | POA: Diagnosis not present

## 2018-08-14 DIAGNOSIS — F329 Major depressive disorder, single episode, unspecified: Secondary | ICD-10-CM | POA: Insufficient documentation

## 2018-08-14 DIAGNOSIS — O26891 Other specified pregnancy related conditions, first trimester: Secondary | ICD-10-CM | POA: Insufficient documentation

## 2018-08-14 DIAGNOSIS — Z3201 Encounter for pregnancy test, result positive: Secondary | ICD-10-CM

## 2018-08-14 DIAGNOSIS — R109 Unspecified abdominal pain: Secondary | ICD-10-CM | POA: Diagnosis not present

## 2018-08-14 LAB — WET PREP, GENITAL
SPERM: NONE SEEN
Trich, Wet Prep: NONE SEEN
Yeast Wet Prep HPF POC: NONE SEEN

## 2018-08-14 LAB — URINALYSIS, ROUTINE W REFLEX MICROSCOPIC
Bilirubin Urine: NEGATIVE
Glucose, UA: NEGATIVE mg/dL
HGB URINE DIPSTICK: NEGATIVE
KETONES UR: 80 mg/dL — AB
NITRITE: NEGATIVE
PROTEIN: NEGATIVE mg/dL
Specific Gravity, Urine: 1.021 (ref 1.005–1.030)
pH: 5 (ref 5.0–8.0)

## 2018-08-14 LAB — CBC
HCT: 37.5 % (ref 36.0–46.0)
Hemoglobin: 12.8 g/dL (ref 12.0–15.0)
MCH: 30 pg (ref 26.0–34.0)
MCHC: 34.1 g/dL (ref 30.0–36.0)
MCV: 88 fL (ref 80.0–100.0)
Platelets: 276 10*3/uL (ref 150–400)
RBC: 4.26 MIL/uL (ref 3.87–5.11)
RDW: 13.3 % (ref 11.5–15.5)
WBC: 8.5 10*3/uL (ref 4.0–10.5)
nRBC: 0 % (ref 0.0–0.2)

## 2018-08-14 LAB — POCT PREGNANCY, URINE
PREG TEST UR: POSITIVE — AB
PREG TEST UR: POSITIVE — AB

## 2018-08-14 LAB — HCG, QUANTITATIVE, PREGNANCY: HCG, BETA CHAIN, QUANT, S: 43642 m[IU]/mL — AB (ref <5)

## 2018-08-14 MED ORDER — PROMETHAZINE HCL 25 MG PO TABS: 12.5000 mg | ORAL_TABLET | Freq: Four times a day (QID) | ORAL | 0 refills | Status: AC | PRN

## 2018-08-14 MED ORDER — PROMETHAZINE HCL 25 MG/ML IJ SOLN
25.0000 mg | Freq: Once | INTRAVENOUS | Status: AC
  Administered 2018-08-14: 25 mg via INTRAVENOUS
  Filled 2018-08-14: qty 1

## 2018-08-14 NOTE — MAU Provider Note (Signed)
History     CSN: [id number]  Arrival date and time: 08/14/18 1807   First Provider Initiated Contact with Patient 08/14/18 2003      Chief Complaint  Patient presents with  . Pelvic Pain  . Emesis  . Vaginal Bleeding   Sonia Miller is a 22 y.o. G1P0 at [redacted]w[redacted]d who presents today with cramping, spotting, headache, nausea and vomiting. She states that she has had pain off and on for about 2 weeks. She has had spotting off and on for a couple of days. She has now also started having nausea and vomiting. She states that she only vomits once per day, but that she has nausea all the time. The nausea prevents her from being able to eat or drink.   Pelvic Pain  The patient's primary symptoms include pelvic pain. The patient's pertinent negatives include no vaginal discharge. This is a new problem. The current episode started in the past 7 days. The problem occurs intermittently. The problem has been gradually worsening. Pain severity now: 8/10. The problem affects both sides. She is pregnant. Associated symptoms include headaches, nausea and vomiting. Pertinent negatives include no chills or fever. The vaginal discharge was bloody. The vaginal bleeding is spotting. Nothing aggravates the symptoms. She has tried nothing for the symptoms.  Emesis   This is a new problem. Episode onset: 2 weeks  The problem occurs less than 2 times per day. The emesis has an appearance of stomach contents. There has been no fever. Associated symptoms include headaches. Pertinent negatives include no chills or fever. Risk factors: pregnancy  She has tried nothing for the symptoms.    OB History    Gravida  1   Para      Term      Preterm      AB      Living        SAB      TAB      Ectopic      Multiple      Live Births              Past Medical History:  Diagnosis Date  . Anxiety   . Depression     Past Surgical History:  Procedure Laterality Date  . NO PAST SURGERIES       Family History  Family history unknown: Yes    Social History   Tobacco Use  . Smoking status: Never Smoker  . Smokeless tobacco: Never Used  Substance Use Topics  . Alcohol use: Not Currently    Frequency: Never  . Drug use: Not Currently    Types: Marijuana    Allergies:  Allergies  Allergen Reactions  . Latex Rash    Medications Prior to Admission  Medication Sig Dispense Refill Last Dose  . naproxen (NAPROSYN) 500 MG tablet Take 1 tablet (500 mg total) by mouth 2 (two) times daily with a meal. 10 tablet 0     Review of Systems  Constitutional: Negative for chills and fever.  Gastrointestinal: Positive for nausea and vomiting.  Genitourinary: Positive for pelvic pain and vaginal bleeding. Negative for vaginal discharge.  Neurological: Positive for headaches.   Physical Exam   Blood pressure 104/68, pulse 84, temperature 98.1 F (36.7 C), temperature source Oral, resp. rate 17, height 5' 0.5" (1.537 m), weight 63.5 kg, last menstrual period 06/28/2018.  Physical Exam  Nursing note and vitals reviewed. Constitutional: She is oriented to person, place, and time. She appears well-developed and  well-nourished. No distress.  HENT:  Head: Normocephalic.  Cardiovascular: Normal rate.  Respiratory: Effort normal.  GI: Soft. There is no tenderness. There is no rebound.  Neurological: She is alert and oriented to person, place, and time.  Skin: Skin is warm and dry.  Psychiatric: She has a normal mood and affect.   Results for orders placed or performed during the hospital encounter of 08/14/18 (from the past 24 hour(s))  Urinalysis, Routine w reflex microscopic     Status: Abnormal   Collection Time: 08/14/18  7:07 PM  Result Value Ref Range   Color, Urine YELLOW YELLOW   APPearance HAZY (A) CLEAR   Specific Gravity, Urine 1.021 1.005 - 1.030   pH 5.0 5.0 - 8.0   Glucose, UA NEGATIVE NEGATIVE mg/dL   Hgb urine dipstick NEGATIVE NEGATIVE   Bilirubin Urine  NEGATIVE NEGATIVE   Ketones, ur 80 (A) NEGATIVE mg/dL   Protein, ur NEGATIVE NEGATIVE mg/dL   Nitrite NEGATIVE NEGATIVE   Leukocytes, UA SMALL (A) NEGATIVE   RBC / HPF 0-5 0 - 5 RBC/hpf   WBC, UA 0-5 0 - 5 WBC/hpf   Bacteria, UA RARE (A) NONE SEEN   Squamous Epithelial / LPF 0-5 0 - 5   Mucus PRESENT   Pregnancy, urine POC     Status: Abnormal   Collection Time: 08/14/18  7:10 PM  Result Value Ref Range   Preg Test, Ur POSITIVE (A) NEGATIVE  CBC     Status: None   Collection Time: 08/14/18  8:03 PM  Result Value Ref Range   WBC 8.5 4.0 - 10.5 K/uL   RBC 4.26 3.87 - 5.11 MIL/uL   Hemoglobin 12.8 12.0 - 15.0 g/dL   HCT 16.137.5 09.636.0 - 04.546.0 %   MCV 88.0 80.0 - 100.0 fL   MCH 30.0 26.0 - 34.0 pg   MCHC 34.1 30.0 - 36.0 g/dL   RDW 40.913.3 81.111.5 - 91.415.5 %   Platelets 276 150 - 400 K/uL   nRBC 0.0 0.0 - 0.2 %  hCG, quantitative, pregnancy     Status: Abnormal   Collection Time: 08/14/18  8:03 PM  Result Value Ref Range   hCG, Beta Chain, Quant, S [phone] (H) <5 mIU/mL  Wet prep, genital     Status: Abnormal   Collection Time: 08/14/18  8:22 PM  Result Value Ref Range   Yeast Wet Prep HPF POC NONE SEEN NONE SEEN   Trich, Wet Prep NONE SEEN NONE SEEN   Clue Cells Wet Prep HPF POC PRESENT (A) NONE SEEN   WBC, Wet Prep HPF POC MODERATE (A) NONE SEEN   Sperm NONE SEEN     MAU Course  Procedures  MDM Patient has had 1L of LR with 25 of phenergan. She reports feeling better, and is tolerating PO.  Assessment and Plan   1. Nausea/vomiting in pregnancy   2. Abdominal pain affecting pregnancy   3. [redacted] weeks gestation of pregnancy   4. Intrauterine pregnancy    DC home Comfort measures reviewed  1st Trimester precautions  Bleeding precautions RX: phenergan PRN #[id number]   Return to MAU as needed FU with OB as planned  Follow-up Information    [facility], [facility].   Contact information: [address] [place] [address] [phone]             Thressa ShellerHeather Hogan 08/14/2018, 8:17 PM

## 2018-08-14 NOTE — MAU Note (Signed)
Pt reports she has been very nausea,, and vomiting a lot. Last pm started having lower back and lower abd pain. Started bleeding 2 days ago, positive home preg on 10/31

## 2018-08-14 NOTE — Progress Notes (Signed)
Pad not working so pt signed hardcopy at d/c home and attached to chart

## 2018-08-14 NOTE — ED Notes (Signed)
[facility]. Pt agreeable to plan, sent to ER.

## 2018-08-14 NOTE — ED Triage Notes (Signed)
Pt reports that she is six weeks pregnant with 5 positive tests at home.  She is here today with two weeks of N&V stating she hasnt been able to keep anything down.  She reports migraine like headaches as well, and spotting two days ago and new onset LLQ pressure that started last night.

## 2018-08-14 NOTE — Progress Notes (Signed)
Heather Hogan CNM in to discuss test results and d/c plan. Written and verbal d/c instructions given and understanding voiced. 

## 2018-08-14 NOTE — Discharge Instructions (Signed)
[facility]  ° ° °[facility]       Phone: [phone] ° °[facility] Phone: [phone] ° °[facility] [place]  Phone: [phone] ° °[facility] [place]  Phone: [phone] ° °[facility]  Phone: [phone] ° °[facility]       Phone: [phone] ° °[facility]    Phone: [phone]  ° °[facility]    Phone: [phone] ° °[facility]    Phone: [phone] ° °[facility]    Phone: [phone] ° °[facility]    Phone: [phone] ° °[facility]       Phone: [phone]  ° °[facility]  Phone: [phone] ° °[facility]    Phone: [phone] ° °[facility]   Phone: [phone] ° °[facility]      Phone: [phone] ° °[facility]    Phone: [phone] ° °Safe Medications in Pregnancy  ° °Acne: °Benzoyl Peroxide °Salicylic Acid ° °Backache/Headache: °Tylenol: 2 regular strength every 4 hours OR °             2 Extra strength every 6 hours ° °Colds/Coughs/Allergies: °Benadryl (alcohol free) 25 mg every 6 hours as needed °Breath right strips °Claritin °Cepacol throat lozenges °Chloraseptic throat spray °Cold-Eeze- up to three times per day °Cough drops, alcohol free °Flonase (by prescription only) °Guaifenesin °Mucinex °Robitussin DM (plain only, alcohol free) °Saline nasal spray/drops °Sudafed (pseudoephedrine) & Actifed ** use only after [redacted] weeks gestation and if you do not have high blood pressure °Tylenol °Vicks Vaporub °Zinc lozenges °Zyrtec  ° °Constipation: °Colace °Ducolax suppositories °Fleet enema °Glycerin suppositories °Metamucil °Milk of  magnesia °Miralax °Senokot °Smooth move tea ° °Diarrhea: °Kaopectate °Imodium A-D ° °*NO pepto Bismol ° °Hemorrhoids: °Anusol °Anusol HC °Preparation H °Tucks ° °Indigestion: °Tums °Maalox °Mylanta °Zantac  °Pepcid ° °Insomnia: °Benadryl (alcohol free) 25mg every 6 hours as needed °Tylenol PM °Unisom, no Gelcaps ° °Leg Cramps: °Tums °MagGel ° °Nausea/Vomiting:  °Bonine °Dramamine °Emetrol °Ginger extract °Sea bands °Meclizine  °Nausea medication to take during pregnancy:  °Unisom (doxylamine succinate 25 mg tablets) Take one tablet daily at bedtime. If symptoms are not adequately controlled, the dose can be increased to a maximum recommended dose of two tablets daily (1/2 tablet in the morning, 1/2 tablet mid-afternoon and one at bedtime). °Vitamin B6 100mg tablets. Take one tablet twice a day (up to 200 mg per day). ° °Skin Rashes: °Aveeno products °Benadryl cream or 25mg every 6 hours as needed °Calamine Lotion °1% cortisone cream ° °Yeast infection: °Gyne-lotrimin 7 °Monistat 7 ° ° °**If taking multiple medications, please check labels to avoid duplicating the same active ingredients °**take medication as directed on the label °** Do not exceed 4000 mg of tylenol in 24 hours °**Do not take medications that contain aspirin or ibuprofen ° ° ° ° °

## 2018-08-15 LAB — ABO/RH: ABO/RH(D): A POS

## 2018-08-17 LAB — GC/CHLAMYDIA PROBE AMP (~~LOC~~) NOT AT ARMC
Chlamydia: NEGATIVE
NEISSERIA GONORRHEA: NEGATIVE

## 2018-08-21 ENCOUNTER — Ambulatory Visit: Payer: 59 | Admitting: Family Medicine

## 2018-08-21 ENCOUNTER — Encounter: Payer: Self-pay | Admitting: Family Medicine

## 2018-08-21 VITALS — BP 98/76 | HR 90 | Temp 98.6°F | Wt 143.0 lb

## 2018-08-21 DIAGNOSIS — R1030 Lower abdominal pain, unspecified: Secondary | ICD-10-CM

## 2018-08-21 DIAGNOSIS — F322 Major depressive disorder, single episode, severe without psychotic features: Secondary | ICD-10-CM | POA: Diagnosis not present

## 2018-08-21 MED ORDER — SERTRALINE HCL 25 MG PO TABS: 25.0000 mg | ORAL_TABLET | Freq: Every day | ORAL | 1 refills | Status: DC

## 2018-08-21 NOTE — Progress Notes (Signed)
Subjective:    Patient ID: Sonia Miller, female    DOB: 04-26-1996, 22 y.o.   MRN: [id number]  No chief complaint on file.   HPI Patient was seen today for acute concern.  Pt is a G1P0010 with pmh sig for anxiety and depression.  Pt s/p surgical abortion at 6wks in [place],Tuesday 11/19.  Pt now with intense abdominal pain with BMs.  Pt notes decreased vaginal bleeding, may use 2 pads/day.  This am pt endorses chills and fever, Tmax 101.  Pt denies n/v, passing clots, HAs, dizziness.  Pt has not taken anything for her symptoms.  Pt also notes feeling depressed given her decision.  Pt states "the timing was bad".  Pt endorses increased crying.  Pt would like to restart medication.  In the past was on zoloft.  Past Medical History:  Diagnosis Date  . Anxiety   . Depression     Allergies  Allergen Reactions  . Latex Rash    ROS General: Denies night sweats, changes in weight, changes in   +fever, chills HEENT: Denies headaches, ear pain, changes in vision, rhinorrhea, sore throat CV: Denies CP, palpitations, SOB, orthopnea Pulm: Denies SOB, cough, wheezing GI: Denies nausea, vomiting, diarrhea, constipation  +abdominal pain GU: Denies dysuria, hematuria, frequency, vaginal discharge  +vaginal bleeding Msk: Denies muscle cramps, joint pains Neuro: Denies weakness, numbness, tingling Skin: Denies rashes, bruising Psych: Denies depression, anxiety, hallucinations    Objective:    Blood pressure 98/76, pulse 90, temperature 98.6 F (37 C), temperature source Oral, weight 143 lb (64.9 kg), last menstrual period 06/28/2018, SpO2 98 %.  Gen. Pleasant, well-nourished, in no distress, tearful   Lungs: no accessory muscle use, CTAB, no wheezes or rales Cardiovascular: RRR, no m/r/g, no peripheral edema Abdomen: BS present, soft, diffuse TTP with pain greatest in RLQ and LLQ, ND. Neuro:  A&Ox3, CN II-XII intact, normal gait Skin:  Warm, no lesions/ rash  Wt Readings  from Last 3 Encounters:  08/21/18 143 lb (64.9 kg)  08/14/18 140 lb 0.6 oz (63.5 kg)  02/09/18 138 lb 3.2 oz (62.7 kg)    Lab Results  Component Value Date   WBC 8.5 08/14/2018   HGB 12.8 08/14/2018   HCT 37.5 08/14/2018   PLT 276 08/14/2018   GLUCOSE 91 02/09/2018   ALT 17 02/09/2018   AST 17 02/09/2018   NA 141 02/09/2018   K 3.7 02/09/2018   CL 105 02/09/2018   CREATININE 0.62 02/09/2018   BUN 10 02/09/2018   CO2 28 02/09/2018    Assessment/Plan:  Lower abdominal pain -given recent procedure, h/o fever, and pain on exam pt [facility]. (labs, imaging, etc) Pt seems hesitant. -expressed concerns for ongoing retained POC/infection.  Pt advised risk could include sepsis and/or death.  Depression, major, single episode, severe (HCC)  -PHQ9 score 25 this visit.  Was 23 on 02/05/18 -will restart Zoloft. -consider counseling. - Plan: sertraline (ZOLOFT) 25 MG tablet  F/u in 2-4 wks, sooner if needed  Abbe AmsterdamShannon Shawntavia Saunders, MD

## 2018-08-22 ENCOUNTER — Encounter (HOSPITAL_COMMUNITY): Payer: Self-pay

## 2018-08-22 ENCOUNTER — Telehealth (HOSPITAL_COMMUNITY): Payer: Self-pay | Admitting: Emergency Medicine

## 2018-08-22 ENCOUNTER — Ambulatory Visit (HOSPITAL_COMMUNITY)
Admission: EM | Admit: 2018-08-22 | Discharge: 2018-08-22 | Disposition: A | Discharge status: Home / Self Care | Payer: 59 | Attending: Family Medicine | Admitting: Family Medicine

## 2018-08-22 DIAGNOSIS — K6289 Other specified diseases of anus and rectum: Secondary | ICD-10-CM

## 2018-08-22 MED ORDER — LIDOCAINE 0.5 % EX GEL: CUTANEOUS | 0 refills | Status: DC

## 2018-08-22 MED ORDER — HYDROCORTISONE 2.5 % RE CREA: TOPICAL_CREAM | RECTAL | 0 refills | Status: DC

## 2018-08-22 NOTE — ED Provider Notes (Signed)
[facility]    CSN: [id number] Arrival date & time: 08/22/18  1333     History   Chief Complaint Chief Complaint  Patient presents with  . Rectal Pain    HPI Sonia Miller is a 22 y.o. female.   22 year old female comes in with 2 day history of rectal pain, slight bleeding with wiping. States was seen by PCP yesterday, who wanted her to go to women[facility] due to recent surgical abortion. Patient states she could not afford an ED visit so did not go. She denies abdominal pain, nausea, vomiting. Denies fever, chills, night sweats. States pain is around rectum, that is significantly worse during bowel movements. Denies constipation. States has daily BMs and has not changed appearance. Denies history of hemorrhoids. Denies melena, hematochezia. Denies swelling, erythema, warmth.      Past Medical History:  Diagnosis Date  . Anxiety   . Depression     There are no active problems to display for this patient.   Past Surgical History:  Procedure Laterality Date  . NO PAST SURGERIES      OB History    Gravida  1   Para      Term      Preterm      AB      Living        SAB      TAB      Ectopic      Multiple      Live Births               Home Medications    Prior to Admission medications   Medication Sig Start Date End Date Taking? Authorizing Provider  hydrocortisone (ANUSOL-HC) 2.5 % rectal cream Apply rectally 2 times daily 08/22/18   Cathie Hoops, Layan Zalenski V, PA-C  Lidocaine 0.5 % GEL 1 application 3-4 times a day. Do not exceed 4 applications a day. 08/22/18   Cathie Hoops, Hamzeh Tall V, PA-C  promethazine (PHENERGAN) 25 MG tablet Take 0.5-1 tablets (12.5-25 mg total) by mouth every 6 (six) hours as needed. 08/14/18   Thressa Sheller D, CNM  sertraline (ZOLOFT) 25 MG tablet Take 1 tablet (25 mg total) by mouth daily. 08/21/18   [name], MD    Family History Family History  Family history unknown: Yes    Social History Social History   Tobacco  Use  . Smoking status: Never Smoker  . Smokeless tobacco: Never Used  Substance Use Topics  . Alcohol use: Not Currently    Frequency: Never  . Drug use: Not Currently    Types: Marijuana     Allergies   Latex   Review of Systems Review of Systems  Reason unable to perform ROS: See HPI as above.     Physical Exam Triage Vital Signs ED Triage Vitals  Enc Vitals Group     BP 08/22/18 1425 117/70     Pulse Rate 08/22/18 1425 81     Resp 08/22/18 1425 20     Temp 08/22/18 1425 97.8 F (36.6 C)     Temp Source 08/22/18 1425 Oral     SpO2 08/22/18 1425 100 %     Weight --      Height --      Head Circumference --      Peak Flow --      Pain Score 08/22/18 1426 8     Pain Loc --      Pain Edu? --  Excl. in GC? --    No data found.  Updated Vital Signs BP 117/70 (BP Location: Right Arm)   Pulse 81   Temp 97.8 F (36.6 C) (Oral)   Resp 20   LMP 06/28/2018   SpO2 100%   Breastfeeding? Unknown   Physical Exam  Constitutional: She is oriented to person, place, and time. She appears well-developed and well-nourished. No distress.  HENT:  Head: Normocephalic and atraumatic.  Eyes: Pupils are equal, round, and reactive to light. Conjunctivae are normal.  Genitourinary: Pelvic exam was performed with patient in the knee-chest position.  Genitourinary Comments: Chaperone present.  No swelling, erythema, warmth.  No external hemorrhoids.  Tender to palpation of the rectum.  Neurological: She is alert and oriented to person, place, and time.  Skin: She is not diaphoretic.     UC Treatments / Results  Labs (all labs ordered are listed, but only abnormal results are displayed) Labs Reviewed - No data to display  EKG None  Radiology No results found.  Procedures Procedures (including critical care time)  Medications Ordered in UC Medications - No data to display  Initial Impression / Assessment and Plan / UC Course  I have reviewed the triage vital  signs and the nursing notes.  Pertinent labs & imaging results that were available during my care of the patient were reviewed by me and considered in my medical decision making (see chart for details).    No perirectal abscess/cellulitis. Will treat for possible anal fissure. Start anusol, lidocaine as directed. Sitz baths. Return precautions given. Patient expresses understanding and agrees to plan.  Final Clinical Impressions(s) / UC Diagnoses   Final diagnoses:  Rectal pain    ED Prescriptions    Medication Sig Dispense Auth. Provider   Lidocaine 0.5 % GEL 1 application 3-4 times a day. Do not exceed 4 applications a day. 170 g Murphy Bundick V, PA-C   hydrocortisone (ANUSOL-HC) 2.5 % rectal cream Apply rectally 2 times daily 28.35 g Threasa AlphaYu, April Colter V, PA-C       Damyon Mullane V, [place]PA-C 08/22/18 1524

## 2018-08-22 NOTE — ED Triage Notes (Signed)
Pt presents with rectal pain and bleeding when having a BM.

## 2018-08-22 NOTE — Discharge Instructions (Signed)
Start lidocaine and anusol as needed. Sitz baths. Monitor for constipation. Keep hydrated, your urine should be clear to pale yellow in color. Follow up with PCP/GI if symptoms not improving.

## 2018-08-22 NOTE — Telephone Encounter (Signed)
Pharmacy called seeking clarification of an order.  Lidocaine 0.5 % gel is not available in gel form.  Dr haglar agreed to lidocaine 2 % gel with instructions as previously written.  No further questions from pharmacy

## 2018-09-28 ENCOUNTER — Other Ambulatory Visit: Payer: Self-pay

## 2018-10-15 IMAGING — DX DG CHEST 2V
2 series · 2 of 2 positions shown · non-contrast
Comparison: None.

CLINICAL DATA: Cough and dizziness for 1 week.

EXAM:
CHEST  2 VIEW

[chest pa]
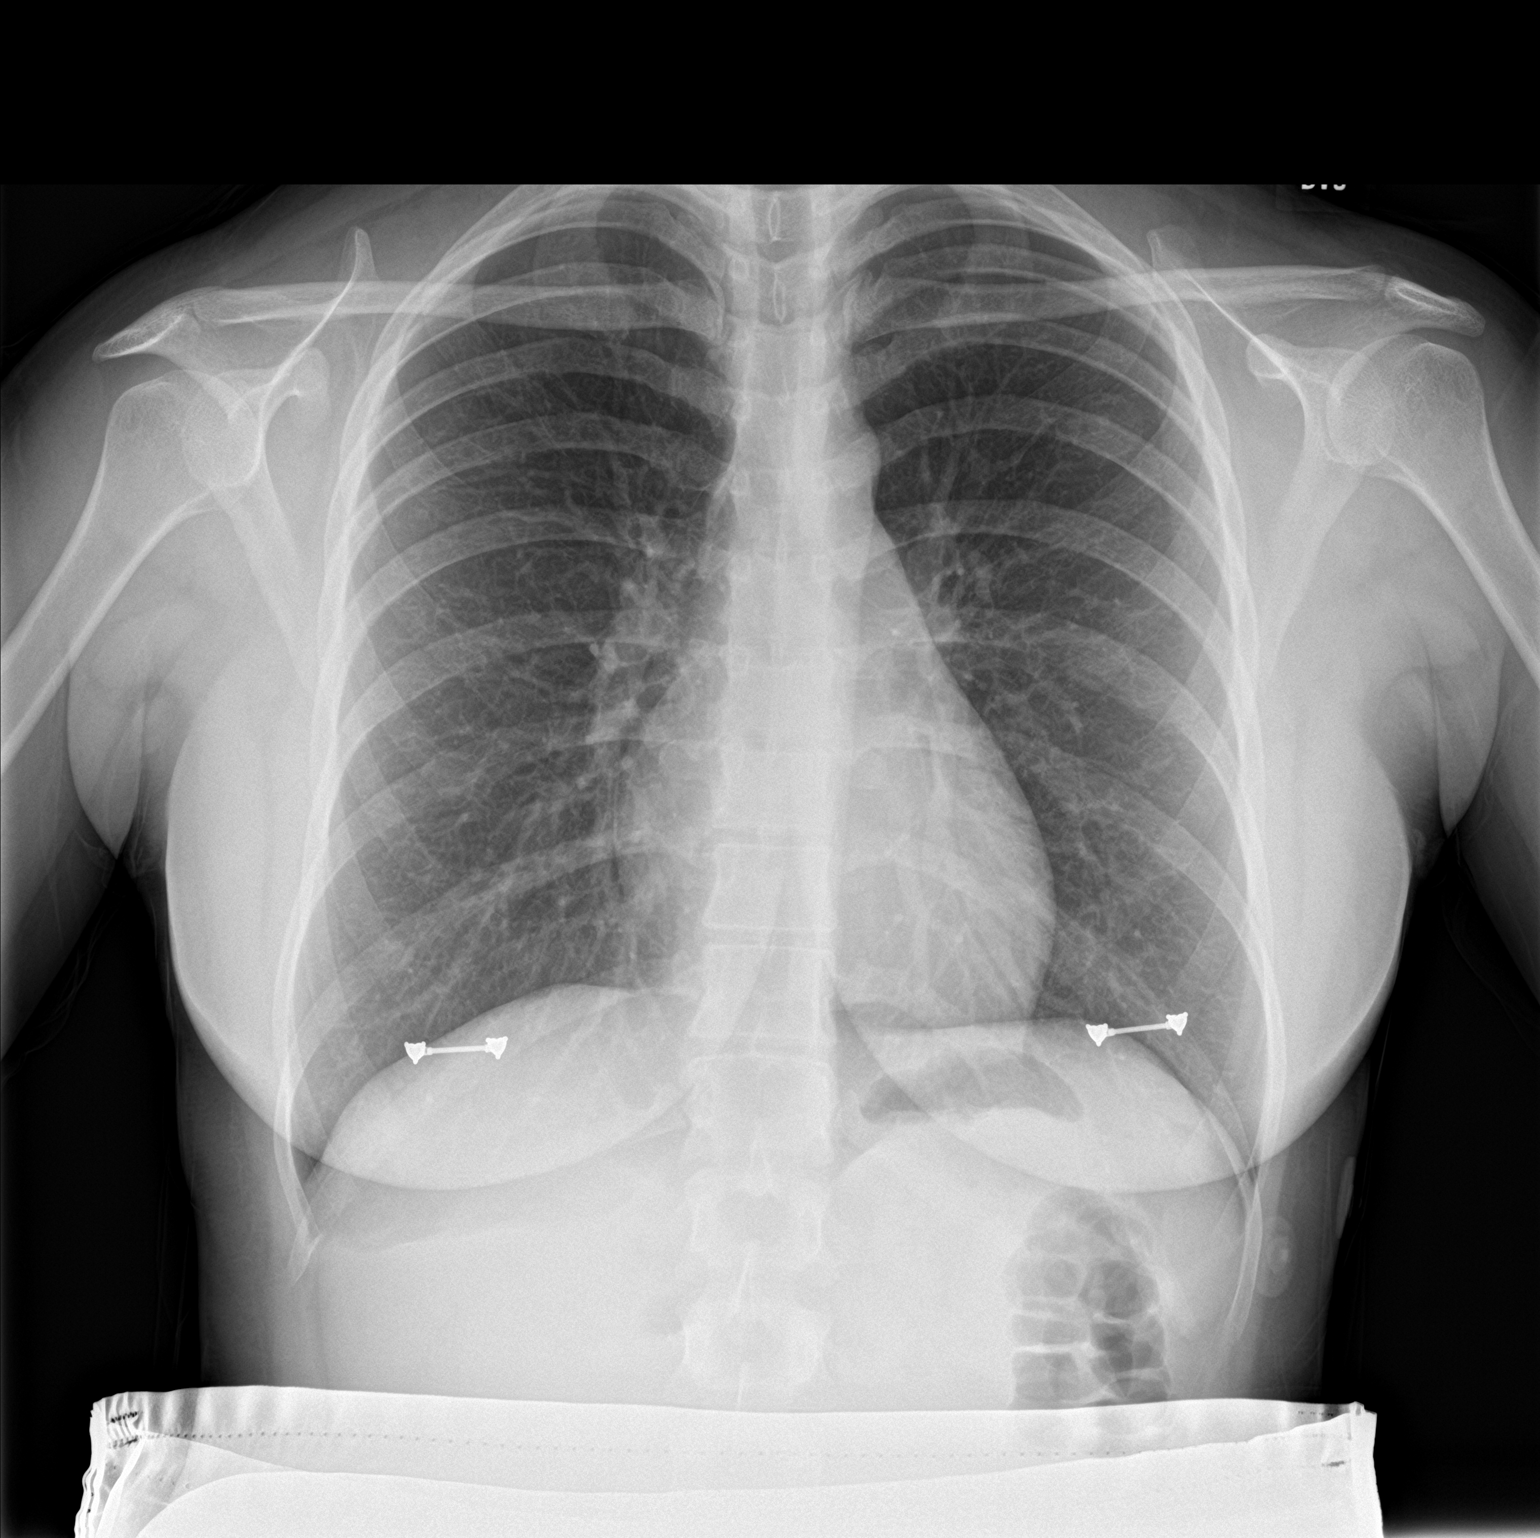

[chest lat]
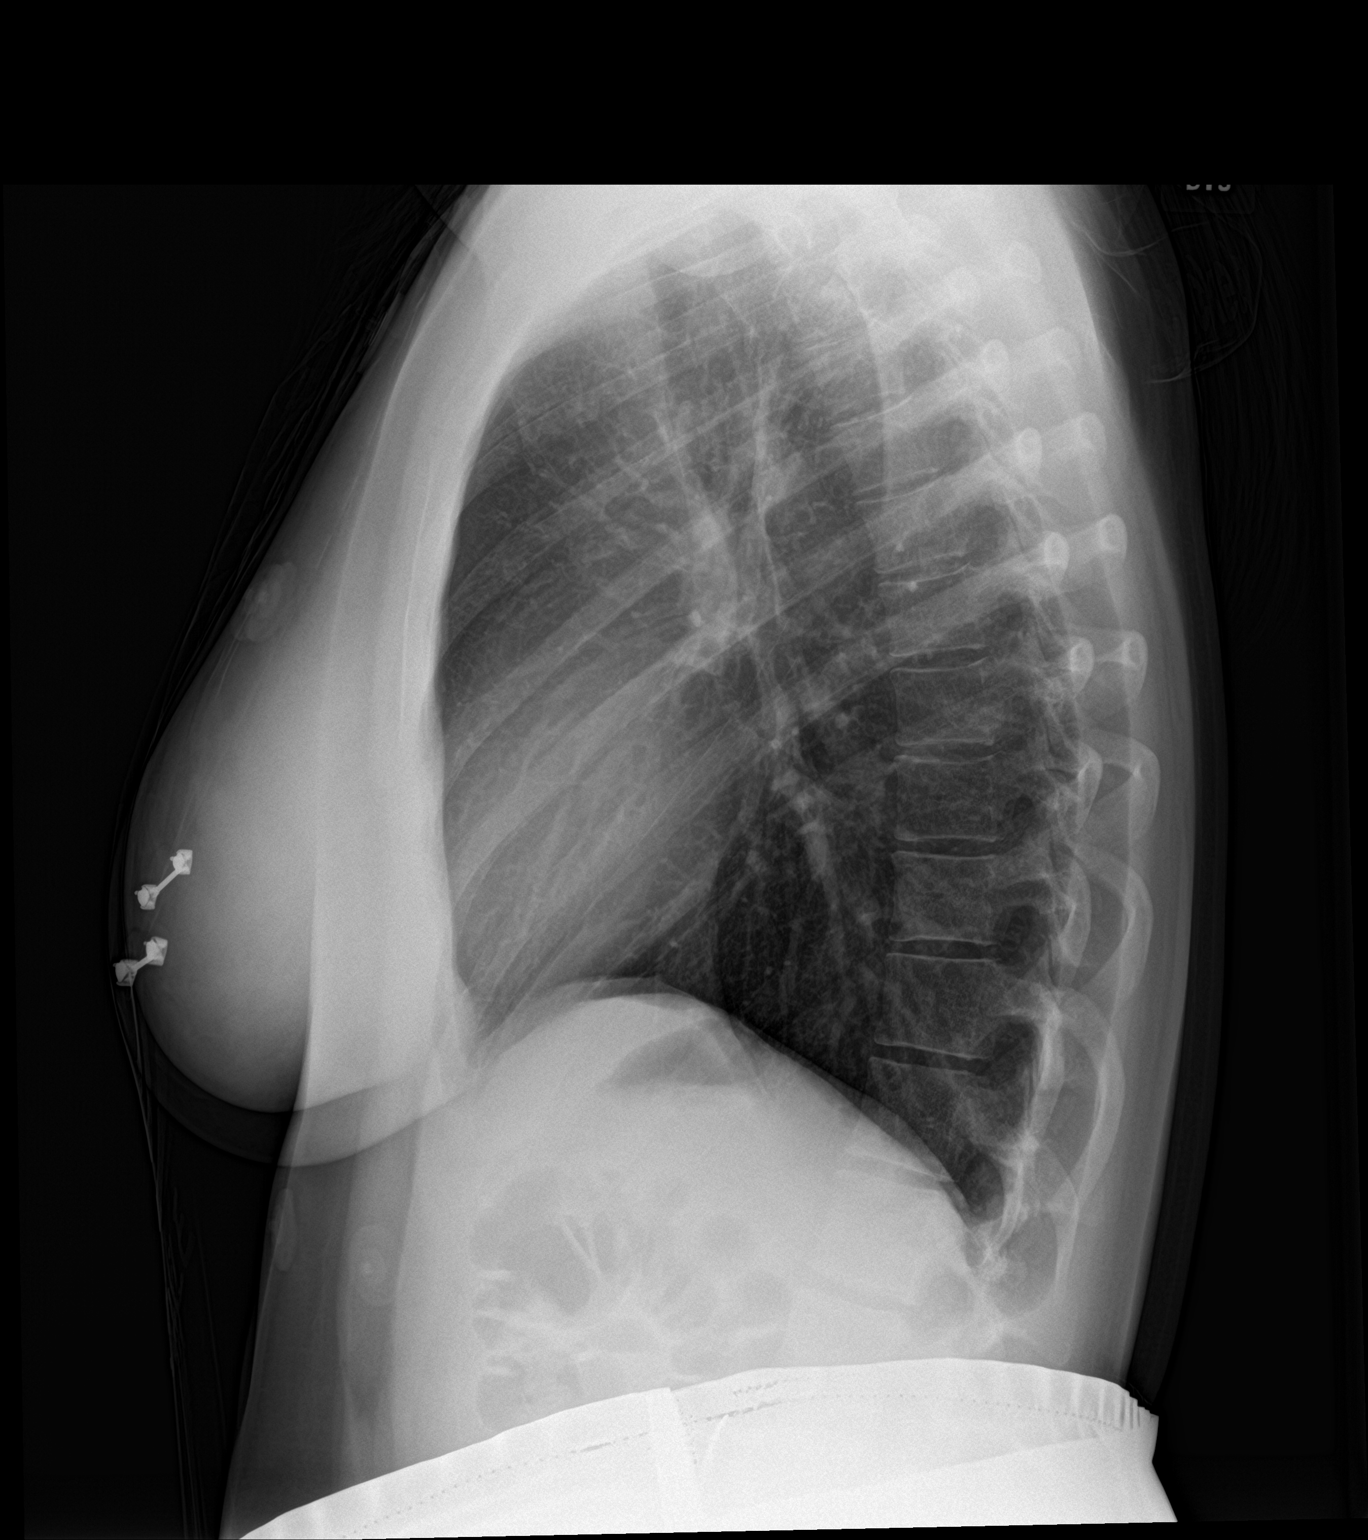

[2 of 2 positions shown; findings below may reference images not displayed]

FINDINGS: The heart size and mediastinal contours are within normal limits.
Both lungs are clear. The visualized skeletal structures are
unremarkable.
IMPRESSION: Normal chest x-ray.

## 2018-12-04 ENCOUNTER — Encounter: Payer: Self-pay | Admitting: Family Medicine

## 2018-12-04 ENCOUNTER — Ambulatory Visit (INDEPENDENT_AMBULATORY_CARE_PROVIDER_SITE_OTHER): Payer: Self-pay | Admitting: Family Medicine

## 2018-12-04 VITALS — BP 104/72 | HR 86 | Temp 98.1°F | Wt 143.0 lb

## 2018-12-04 DIAGNOSIS — F419 Anxiety disorder, unspecified: Secondary | ICD-10-CM

## 2018-12-04 DIAGNOSIS — Z87898 Personal history of other specified conditions: Secondary | ICD-10-CM

## 2018-12-04 DIAGNOSIS — R1084 Generalized abdominal pain: Secondary | ICD-10-CM

## 2018-12-04 DIAGNOSIS — N3000 Acute cystitis without hematuria: Secondary | ICD-10-CM

## 2018-12-04 LAB — POC URINALSYSI DIPSTICK (AUTOMATED)
BILIRUBIN UA: NEGATIVE
GLUCOSE UA: NEGATIVE
KETONES UA: NEGATIVE
NITRITE UA: NEGATIVE
Protein, UA: NEGATIVE
RBC UA: NEGATIVE
Spec Grav, UA: 1.02 (ref 1.010–1.025)
Urobilinogen, UA: 0.2 E.U./dL
pH, UA: 6 (ref 5.0–8.0)

## 2018-12-04 LAB — POCT URINE PREGNANCY: Preg Test, Ur: NEGATIVE

## 2018-12-04 MED ORDER — ONDANSETRON HCL 4 MG PO TABS: 4.0000 mg | ORAL_TABLET | Freq: Three times a day (TID) | ORAL | 0 refills | Status: AC | PRN

## 2018-12-04 MED ORDER — CEPHALEXIN 500 MG PO CAPS: 500.0000 mg | ORAL_CAPSULE | Freq: Two times a day (BID) | ORAL | 0 refills | Status: AC

## 2018-12-04 MED ORDER — SERTRALINE HCL 25 MG PO TABS: 25.0000 mg | ORAL_TABLET | Freq: Every day | ORAL | 5 refills | Status: DC

## 2018-12-04 NOTE — Progress Notes (Signed)
Tried to reach pt via phone at 5:50 pm 5/6, but not answer.  No VM left.  Your urine analysis while in clinic shows signs of possible infection.  A sample of your urine was sent to the lab for urine culture.  A prescription for an antibiotic called Keflex was sent to your pharmacy.  You should take this medication twice a day for the next 7 days to help with a possible urine infection.  We will call you with urine culture results.  Urine pregnancy test was negative.

## 2018-12-04 NOTE — Progress Notes (Signed)
Subjective:    Patient ID: Sonia Miller, female    DOB: November 13, 1995, 23 y.o.   MRN: [id number]  No chief complaint on file. pt accompanied by her boyfriend.  HPI Patient was seen today for ongoing concern.  Pt endorses continued abdominal pain, cramps, and back pain.  Pt states normal pain and cramping can occur at any time.  No a/w food.  Pt denies recent constipation.  Trying to drink more water.  Drinking 1 bottle per day.  Also endorses nausea.  Has a h/o motion sickness in cars unless driving.  Taking phenergan 12.5-25 mg daily.  LMP 10/28/18. Pt states she took a home hCG which was negative.  Pt notes increased anxiety.  Pt states she has been out of Zoloft 25 mg x 1 month.  Pt did not call for refill as she figured she would need to be seen.  Pt states she changed jobs and does not have insurance.   Pt's boyfriend thinks her abd pain was less when on the med.   Past Medical History:  Diagnosis Date  . Anxiety   . Depression     Allergies  Allergen Reactions  . Latex Rash    ROS General: Denies fever, chills, night sweats, changes in weight, changes in appetite HEENT: Denies headaches, ear pain, changes in vision, rhinorrhea, sore throat CV: Denies CP, palpitations, SOB, orthopnea Pulm: Denies SOB, cough, wheezing GI: Denies vomiting, diarrhea, constipation +nausea, abdominal pain GU: Denies dysuria, hematuria, frequency, vaginal discharge Msk: Denies muscle cramps, joint pains  +back pain Neuro: Denies weakness, numbness, tingling Skin: Denies rashes, bruising Psych: Denies depression, hallucinations  +anxiety    Objective:    Blood pressure 104/72, pulse 86, temperature 98.1 F (36.7 C), temperature source Oral, weight 143 lb (64.9 kg), last menstrual period 06/28/2018, SpO2 98 %, unknown if currently breastfeeding.  Gen. Pleasant, well-nourished, in no distress, normal affect   HEENT: [place]/AT, face symmetric, no scleral icterus, PERRLA, nares patent without  drainage, pharynx without erythema or exudate. Lungs: no accessory muscle use, CTAB, no wheezes or rales Cardiovascular: RRR, no m/r/g, no peripheral edema Abdomen: BS present, soft, diffuse TTP, RLQ and LLQ with increased TTP.  ND, no hepatosplenomegaly.   No CVA tenderness. MSK: No TTP of midline spine.  TTP of paraspinal muscles. Neuro:  A&Ox3, CN II-XII intact, normal gait Skin:  Warm, no lesions/ rash  Wt Readings from Last 3 Encounters:  12/04/18 143 lb (64.9 kg)  08/21/18 143 lb (64.9 kg)  08/14/18 140 lb 0.6 oz (63.5 kg)    Lab Results  Component Value Date   WBC 8.5 08/14/2018   HGB 12.8 08/14/2018   HCT 37.5 08/14/2018   PLT 276 08/14/2018   GLUCOSE 91 02/09/2018   ALT 17 02/09/2018   AST 17 02/09/2018   NA 141 02/09/2018   K 3.7 02/09/2018   CL 105 02/09/2018   CREATININE 0.62 02/09/2018   BUN 10 02/09/2018   CO2 28 02/09/2018    Assessment/Plan:  Generalized abdominal pain -discussed various causes including constipation, ovarian cyst, IBS, pregnancy, cholecystitis, etc. -UA with 3+ Leuks, SG 1.020 -declines labs at this time.  History of motion sickness  - Plan: ondansetron (ZOFRAN) 4 MG tablet  Anxiety  -PHQ 9 score 16 -Gad 7 score 16 - Plan: sertraline (ZOLOFT) 25 MG tablet  Acute cystitis without hematuria  -UA with 3+ leuks, SG 1.020 -Rx for Keflex sent to patient's pharmacy -will send for UCx - Plan: Urine Culture, POCT Urinalysis  Dipstick (Automated)  F/u prn  Abbe Amsterdam, MD

## 2018-12-06 LAB — URINE CULTURE
MICRO NUMBER:: 287385
SPECIMEN QUALITY:: ADEQUATE

## 2018-12-16 ENCOUNTER — Encounter: Payer: Self-pay | Admitting: Family Medicine

## 2019-02-18 ENCOUNTER — Encounter: Payer: Self-pay | Admitting: Family Medicine

## 2019-03-11 ENCOUNTER — Other Ambulatory Visit: Payer: Self-pay | Admitting: Family Medicine

## 2019-03-11 DIAGNOSIS — F32A Depression, unspecified: Secondary | ICD-10-CM

## 2019-03-11 DIAGNOSIS — F419 Anxiety disorder, unspecified: Secondary | ICD-10-CM

## 2019-03-11 DIAGNOSIS — F329 Major depressive disorder, single episode, unspecified: Secondary | ICD-10-CM

## 2019-05-06 ENCOUNTER — Ambulatory Visit: Payer: Self-pay | Admitting: Family Medicine

## 2019-07-12 IMAGING — US US OB < 14 WEEKS - US OB TV
1 series · 15 of 28 positions shown · non-contrast
Comparison: None.

CLINICAL DATA: 22 y/o  F; 6 weeks of pain.

EXAM:
OBSTETRIC <14 WK US AND TRANSVAGINAL OB US
TECHNIQUE: Both transabdominal and transvaginal ultrasound examinations were
performed for complete evaluation of the gestation as well as the
maternal uterus, adnexal regions, and pelvic cul-de-sac.
Transvaginal technique was performed to assess early pregnancy.

[Series 1: us ob < 14 weeks - us ob tv · 15 of 38 slices shown]
[im 1/38]
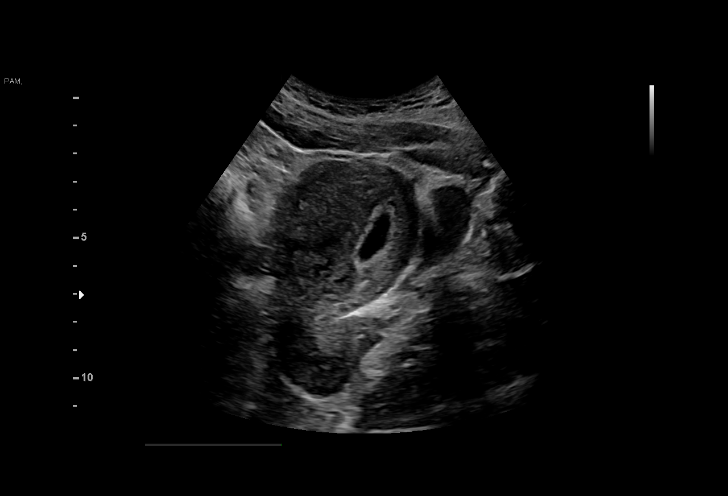
[im 3/38]
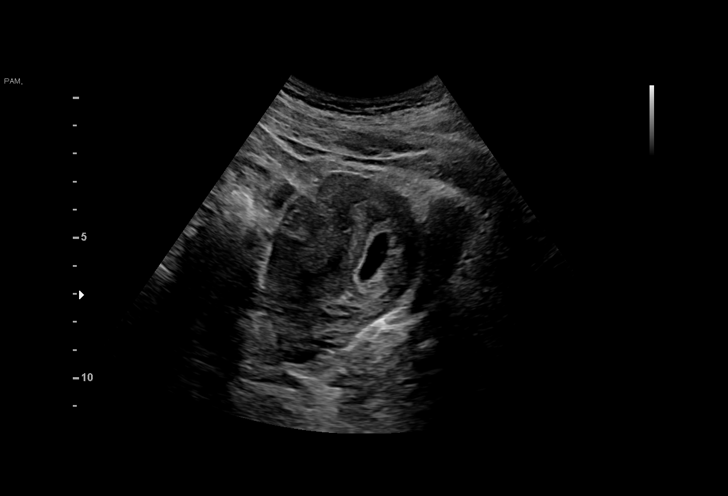
[im 6/38]
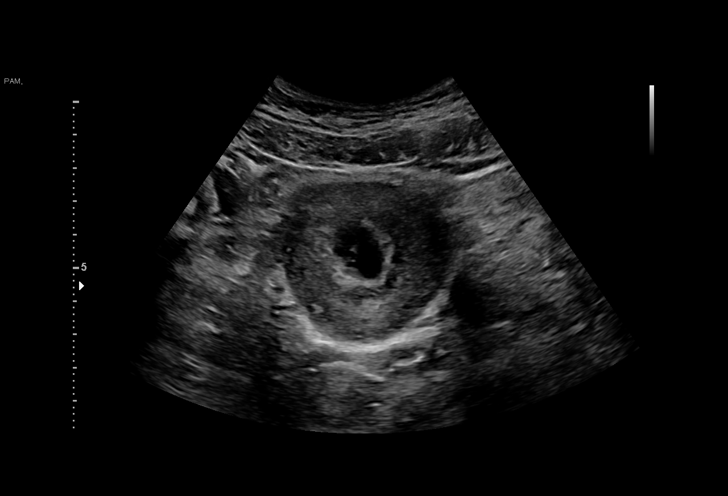
[im 9/38]
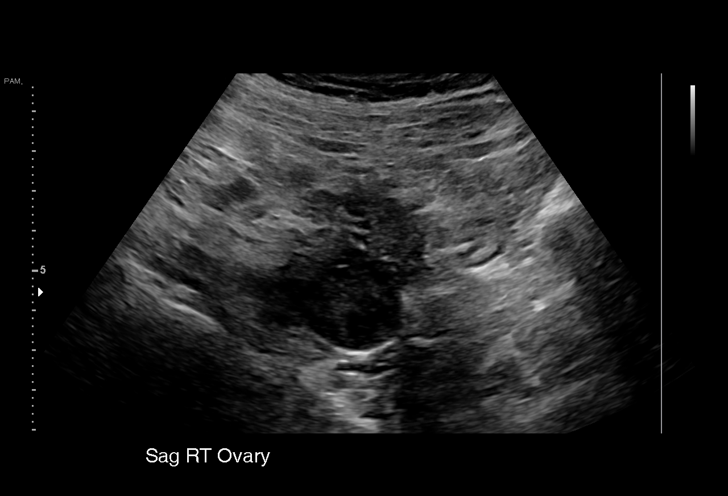
[im 11/38]
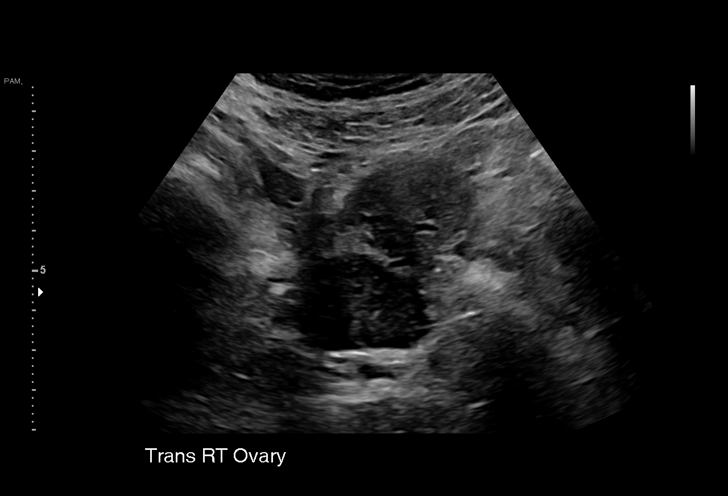
[im 14/38]
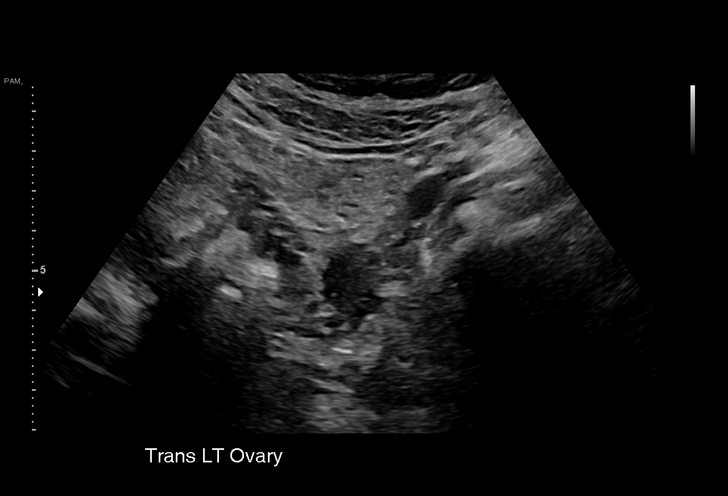
[im 17/38]
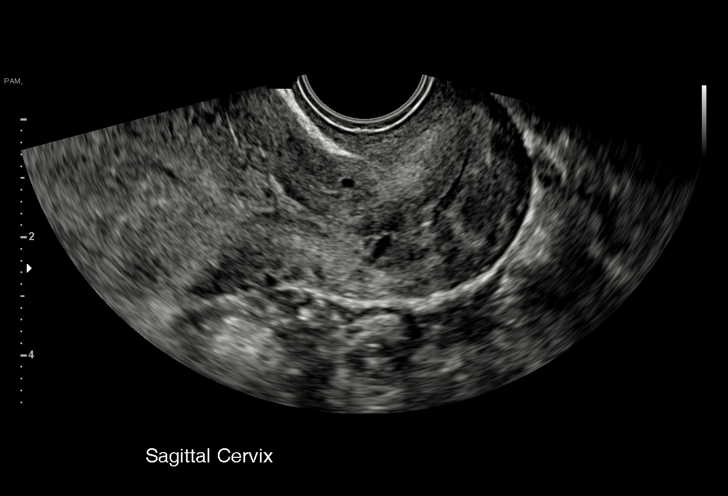
[im 20/38]
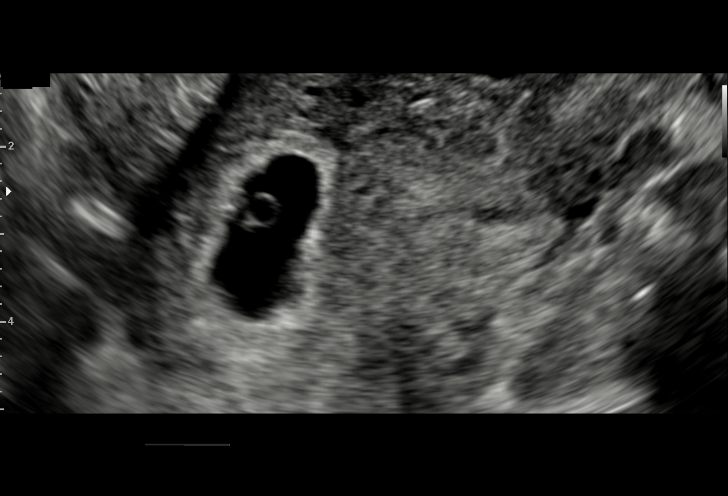
[im 21/38]
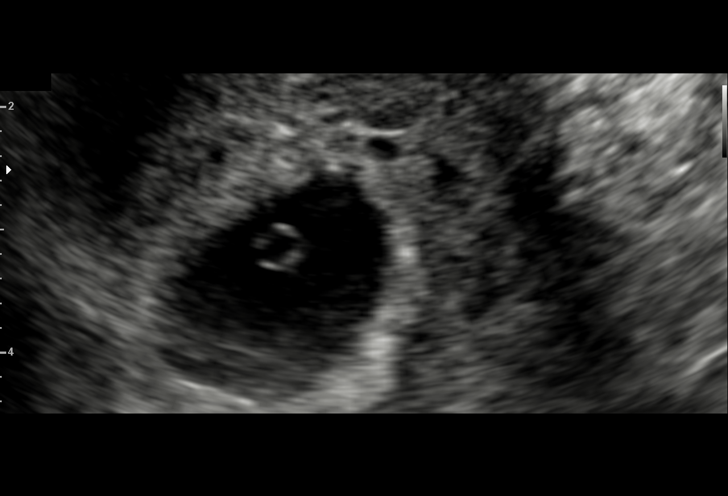
[im 24/38]
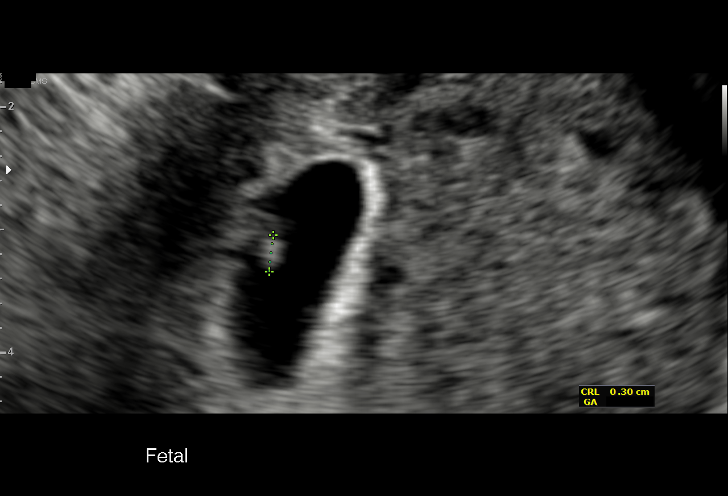
[im 27/38]
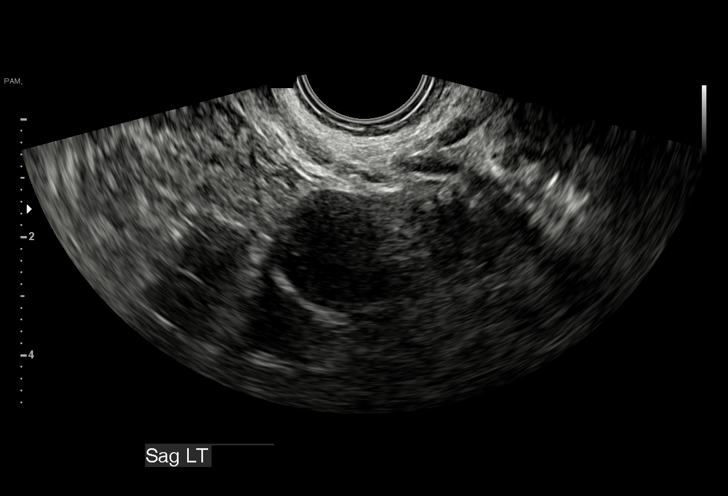
[im 29/38]
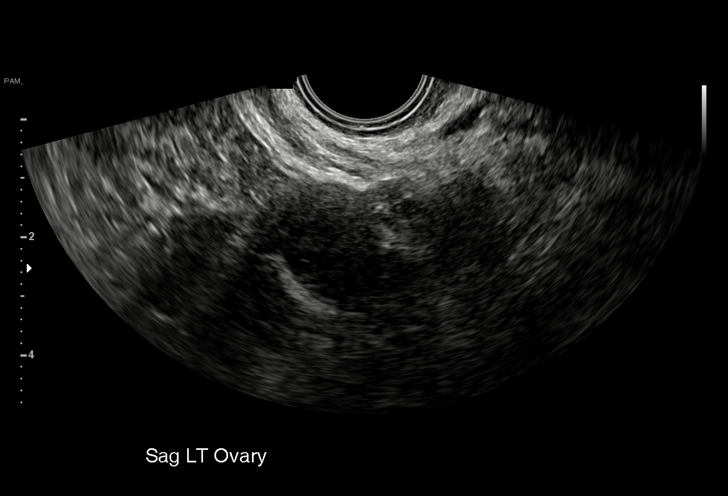
[im 32/38]
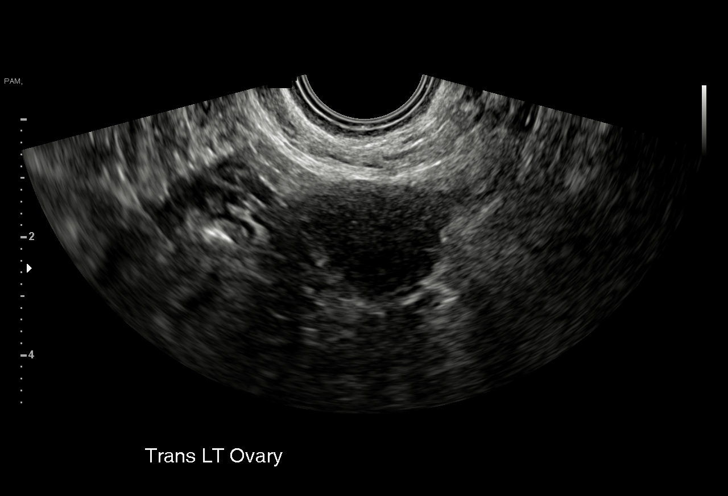
[im 35/38]
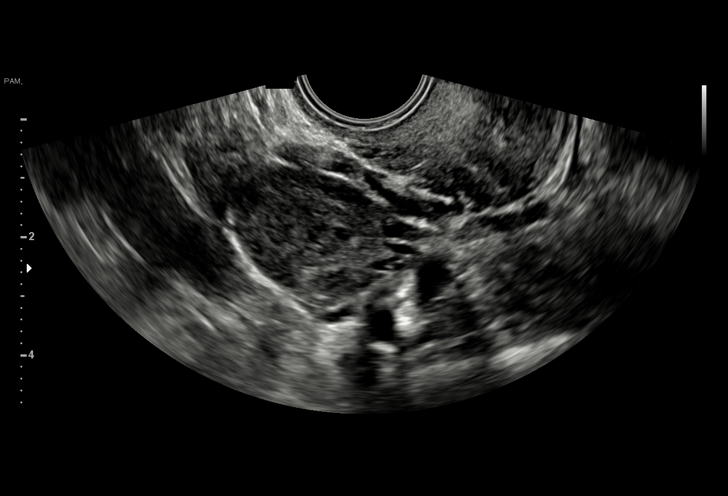
[im 38/38]
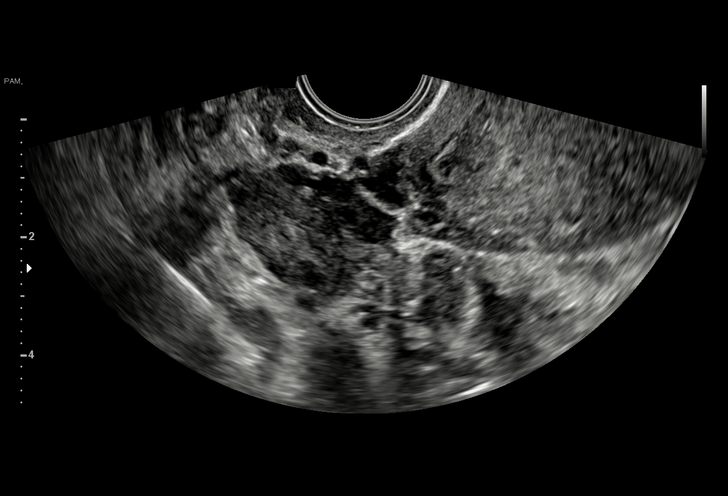

[15 of 28 positions shown; findings below may reference images not displayed]

FINDINGS: Intrauterine gestational sac: Single

Yolk sac:  Visualized.

Embryo:  Visualized.

Cardiac Activity: Visualized.

Heart Rate: 110 bpm

CRL: 3.0 mm   5 w   6 d                  US EDC: 04/10/2019

Subchorionic hemorrhage:  None visualized.

Maternal uterus/adnexae: The right ovary measures 3.4 x 2.2 x
cm. The left ovary measures 2.5 x 2.1 x 1.9 cm. The visible uterus
is unremarkable.
IMPRESSION: Single live intrauterine pregnancy with estimated gestational age of
5 weeks and 6 days. No acute process identified.

## 2019-12-05 ENCOUNTER — Ambulatory Visit: Payer: Self-pay

## 2020-01-21 ENCOUNTER — Other Ambulatory Visit: Payer: Self-pay

## 2020-01-21 ENCOUNTER — Ambulatory Visit (HOSPITAL_COMMUNITY)
Admission: EM | Admit: 2020-01-21 | Discharge: 2020-01-21 | Disposition: A | Discharge status: Home / Self Care | Payer: Self-pay | Attending: Urgent Care | Admitting: Urgent Care

## 2020-01-21 DIAGNOSIS — R42 Dizziness and giddiness: Secondary | ICD-10-CM

## 2020-01-21 DIAGNOSIS — G44209 Tension-type headache, unspecified, not intractable: Secondary | ICD-10-CM

## 2020-01-21 MED ORDER — LEVOCETIRIZINE DIHYDROCHLORIDE 5 MG PO TABS: 5.0000 mg | ORAL_TABLET | Freq: Every evening | ORAL | 0 refills | Status: AC

## 2020-01-21 MED ORDER — NAPROXEN 500 MG PO TABS: 500.0000 mg | ORAL_TABLET | Freq: Two times a day (BID) | ORAL | 0 refills | Status: DC

## 2020-01-21 MED ORDER — MECLIZINE HCL 12.5 MG PO TABS: 12.5000 mg | ORAL_TABLET | Freq: Three times a day (TID) | ORAL | 0 refills | Status: AC | PRN

## 2020-01-21 NOTE — ED Provider Notes (Signed)
[facility]   MRN: [id number] DOB: 23-Jan-1996  Subjective:   Sonia Miller is a 24 y.o. female presenting for 1 week hx of acute onset worsening dizziness. Initially was intermittent but is now constant. Has had slight blurred vision. Has developed frontal headache, intermittently over back of head and upper part of neck.  Recent labs show patient has not had diabetes, anemia.  Patient also denies family history of the same.  Patient hydrates very well, eats regular meals.  Denies drug use.  Denies significant stressors. LMP was last week.   No current facility-administered medications for this encounter.  Current Outpatient Medications:  .  hydrocortisone (ANUSOL-HC) 2.5 % rectal cream, Apply rectally 2 times daily, Disp: 28.35 g, Rfl: 0 .  levocetirizine (XYZAL) 5 MG tablet, Take 1 tablet (5 mg total) by mouth every evening., Disp: 30 tablet, Rfl: 0 .  Lidocaine 0.5 % GEL, 1 application 3-4 times a day. Do not exceed 4 applications a day., Disp: 170 g, Rfl: 0 .  meclizine (ANTIVERT) 12.5 MG tablet, Take 1 tablet (12.5 mg total) by mouth 3 (three) times daily as needed for dizziness., Disp: 30 tablet, Rfl: 0 .  naproxen (NAPROSYN) 500 MG tablet, Take 1 tablet (500 mg total) by mouth 2 (two) times daily., Disp: 30 tablet, Rfl: 0 .  naproxen (NAPROSYN) 500 MG tablet, Take 1 tablet (500 mg total) by mouth 2 (two) times daily., Disp: 30 tablet, Rfl: 0 .  ondansetron (ZOFRAN) 4 MG tablet, Take 1 tablet (4 mg total) by mouth every 8 (eight) hours as needed for nausea or vomiting., Disp: 20 tablet, Rfl: 0 .  promethazine (PHENERGAN) 25 MG tablet, Take 0.5-1 tablets (12.5-25 mg total) by mouth every 6 (six) hours as needed., Disp: 30 tablet, Rfl: 0 .  sertraline (ZOLOFT) 25 MG tablet, Take 1 tablet (25 mg total) by mouth daily. (Patient taking differently: Take 25 mg by mouth daily. Pt not taking), Disp: 30 tablet, Rfl: 1 .  sertraline (ZOLOFT) 25 MG tablet, Take 1 tablet (25 mg  total) by mouth daily. (Patient taking differently: Take 25 mg by mouth daily. Pt not taking), Disp: 30 tablet, Rfl: 5   Allergies  Allergen Reactions  . Latex Rash    Past Medical History:  Diagnosis Date  . Anxiety   . Depression      Past Surgical History:  Procedure Laterality Date  . NO PAST SURGERIES      Family History  Family history unknown: Yes    Social History   Tobacco Use  . Smoking status: Never Smoker  . Smokeless tobacco: Never Used  Substance Use Topics  . Alcohol use: Not Currently  . Drug use: Not Currently    Types: Marijuana    Review of Systems  Constitutional: Negative for fever and malaise/fatigue.  HENT: Negative for congestion, ear discharge, ear pain, hearing loss, sinus pain, sore throat and tinnitus.   Eyes: Positive for blurred vision. Negative for double vision, discharge and redness.  Respiratory: Negative for cough, hemoptysis, shortness of breath and wheezing.   Cardiovascular: Negative for chest pain.  Gastrointestinal: Negative for abdominal pain, diarrhea, nausea and vomiting.  Genitourinary: Negative for dysuria, flank pain and hematuria.  Musculoskeletal: Negative for myalgias and neck pain.  Skin: Negative for itching and rash.  Neurological: Positive for dizziness and headaches. Negative for weakness.  Psychiatric/Behavioral: Negative for depression and substance abuse.     Objective:   Vitals: BP 134/78   Pulse 66  Temp 98.1 F (36.7 C)   Resp 16   LMP 01/14/2020   SpO2 99%   Physical Exam Constitutional:      General: She is not in acute distress.    Appearance: Normal appearance. She is well-developed. She is not ill-appearing, toxic-appearing or diaphoretic.  HENT:     Head: Normocephalic and atraumatic.     Right Ear: Tympanic membrane and ear canal normal. No drainage or tenderness. No middle ear effusion. Tympanic membrane is not erythematous.     Left Ear: Tympanic membrane and ear canal normal. No  drainage or tenderness.  No middle ear effusion. Tympanic membrane is not erythematous.     Nose: Nose normal. No congestion or rhinorrhea.     Mouth/Throat:     Mouth: Mucous membranes are moist. No oral lesions.     Pharynx: Oropharynx is clear. No pharyngeal swelling, oropharyngeal exudate, posterior oropharyngeal erythema or uvula swelling.     Tonsils: No tonsillar exudate or tonsillar abscesses.  Eyes:     General: No scleral icterus.       Right eye: No discharge.        Left eye: No discharge.     Extraocular Movements: Extraocular movements intact.     Right eye: Normal extraocular motion.     Left eye: Normal extraocular motion.     Conjunctiva/sclera: Conjunctivae normal.     Pupils: Pupils are equal, round, and reactive to light.  Cardiovascular:     Rate and Rhythm: Normal rate and regular rhythm.     Pulses: Normal pulses.     Heart sounds: Normal heart sounds. No murmur. No friction rub. No gallop.   Pulmonary:     Effort: Pulmonary effort is normal. No respiratory distress.     Breath sounds: Normal breath sounds. No stridor. No wheezing, rhonchi or rales.  Musculoskeletal:     Cervical back: Normal range of motion and neck supple.  Lymphadenopathy:     Cervical: No cervical adenopathy.  Skin:    General: Skin is warm and dry.     Findings: No rash.  Neurological:     General: No focal deficit present.     Mental Status: She is alert and oriented to person, place, and time.     Cranial Nerves: No cranial nerve deficit.     Motor: No weakness.     Coordination: Coordination normal.     Gait: Gait normal.     Deep Tendon Reflexes: Reflexes normal.  Psychiatric:        Mood and Affect: Mood normal.        Behavior: Behavior normal.        Thought Content: Thought content normal.        Judgment: Judgment normal.      Assessment and Plan :   PDMP not reviewed this encounter.  1. Dizziness   2. Tension headache     Physical exam findings and vital  signs very reassuring.  Lab review shows negative diabetes and anemia.  Patient does not describe a vertigo type sensation and therefore lower suspicion for BPPV. Low suspicion for COVID. Offered patient meclizine for dizziness, naproxen for headaches, trial of antihistamine. If no improvement by Monday, consider amoxicillin to address sinus infection. Consider COVID 19 check, pregnancy test (not done today as LMP was last week).  Counseled patient on potential for adverse effects with medications prescribed/recommended today, ER and return-to-clinic precautions discussed, patient verbalized understanding.   Jaynee Eagles, PA-C 01/22/20 1010

## 2020-01-21 NOTE — ED Triage Notes (Signed)
Pt c/o dizziness and blurred vision x 1 week.

## 2020-01-21 NOTE — Discharge Instructions (Signed)
If your symptoms are not improved/resolved by Monday, then let's use amoxicillin to address your symptoms as a sinus infection. Use mychart to help with this and set up a video visit.

## 2020-01-21 NOTE — ED Triage Notes (Signed)
Pt also c/o headache. Tylenol not helping

## 2020-08-11 ENCOUNTER — Ambulatory Visit (HOSPITAL_COMMUNITY)
Admission: EM | Admit: 2020-08-11 | Discharge: 2020-08-11 | Disposition: A | Discharge status: Home / Self Care | Payer: Managed Care, Other (non HMO) | Attending: Family Medicine | Admitting: Family Medicine

## 2020-08-11 ENCOUNTER — Other Ambulatory Visit: Payer: Self-pay

## 2020-08-11 ENCOUNTER — Encounter (HOSPITAL_COMMUNITY): Payer: Self-pay

## 2020-08-11 DIAGNOSIS — Z113 Encounter for screening for infections with a predominantly sexual mode of transmission: Secondary | ICD-10-CM | POA: Insufficient documentation

## 2020-08-11 DIAGNOSIS — R1030 Lower abdominal pain, unspecified: Secondary | ICD-10-CM | POA: Insufficient documentation

## 2020-08-11 DIAGNOSIS — M545 Low back pain, unspecified: Secondary | ICD-10-CM | POA: Diagnosis not present

## 2020-08-11 DIAGNOSIS — Z3202 Encounter for pregnancy test, result negative: Secondary | ICD-10-CM

## 2020-08-11 DIAGNOSIS — N898 Other specified noninflammatory disorders of vagina: Secondary | ICD-10-CM | POA: Diagnosis not present

## 2020-08-11 DIAGNOSIS — N939 Abnormal uterine and vaginal bleeding, unspecified: Secondary | ICD-10-CM | POA: Diagnosis present

## 2020-08-11 DIAGNOSIS — R110 Nausea: Secondary | ICD-10-CM | POA: Diagnosis not present

## 2020-08-11 DIAGNOSIS — Z79899 Other long term (current) drug therapy: Secondary | ICD-10-CM | POA: Diagnosis not present

## 2020-08-11 LAB — POCT URINALYSIS DIPSTICK, ED / UC
Bilirubin Urine: NEGATIVE
Glucose, UA: NEGATIVE mg/dL
Leukocytes,Ua: NEGATIVE
Nitrite: NEGATIVE
Protein, ur: 100 mg/dL — AB
Specific Gravity, Urine: 1.025 (ref 1.005–1.030)
Urobilinogen, UA: 1 mg/dL (ref 0.0–1.0)
pH: 7 (ref 5.0–8.0)

## 2020-08-11 LAB — POC URINE PREG, ED: Preg Test, Ur: NEGATIVE

## 2020-08-11 MED ORDER — IBUPROFEN 600 MG PO TABS: 600.0000 mg | ORAL_TABLET | Freq: Three times a day (TID) | ORAL | 0 refills | Status: AC | PRN

## 2020-08-11 MED ORDER — ONDANSETRON 4 MG PO TBDP
ORAL_TABLET | ORAL | Status: AC
  Filled 2020-08-11: qty 1

## 2020-08-11 MED ORDER — KETOROLAC TROMETHAMINE 30 MG/ML IJ SOLN
INTRAMUSCULAR | Status: AC
  Filled 2020-08-11: qty 1

## 2020-08-11 MED ORDER — ONDANSETRON 4 MG PO TBDP
4.0000 mg | ORAL_TABLET | Freq: Once | ORAL | Status: AC
  Administered 2020-08-11: 4 mg via ORAL

## 2020-08-11 MED ORDER — ONDANSETRON 4 MG PO TBDP: 4.0000 mg | ORAL_TABLET | Freq: Three times a day (TID) | ORAL | 0 refills | Status: DC | PRN

## 2020-08-11 MED ORDER — KETOROLAC TROMETHAMINE 30 MG/ML IJ SOLN
30.0000 mg | Freq: Once | INTRAMUSCULAR | Status: AC
  Administered 2020-08-11: 30 mg via INTRAMUSCULAR

## 2020-08-11 NOTE — Discharge Instructions (Signed)
Like we talked about this could be a kidney stone, ovarian cyst, vaginal infection, UTI. Sending your urine for culture. Sending swab for STD screening. Toradol given here for pain. Zofran for nausea.  Prescriptions sent to the pharmacy. Drink plenty of water.  ER for worsening symptoms.

## 2020-08-11 NOTE — ED Triage Notes (Signed)
Pt presents with nausea, dizziness, lower abdominal pain, lower back pain, and abnormal vaginal bleeding X 5 days.

## 2020-08-11 NOTE — ED Provider Notes (Signed)
[facility]    CSN: [id number] Arrival date & time: 08/11/20  1318      History   Chief Complaint Chief Complaint  Patient presents with  . Nausea  . Abdominal Pain  . Back Pain  . Abnormal Vaginal Bleeding    HPI Sonia Miller is a 24 y.o. female.   Patient is a 24 year old presents today with nausea, lower abdominal pain, lower back pain.  This began as a couple days later she has vaginal bleeding.  Just had her menstrual cycle at the end of October.  This will be a regular rhythm for her.  Denies any specific abnormal discharge or odor.  No dysuria, hematuria or urinary frequency.  Does have a history of ovarian cyst.  No fevers, chills, vomiting or diarrhea.  No specific concern for STDs.     Past Medical History:  Diagnosis Date  . Anxiety   . Depression     There are no problems to display for this patient.   Past Surgical History:  Procedure Laterality Date  . NO PAST SURGERIES      OB History    Gravida  1   Para      Term      Preterm      AB      Living        SAB      TAB      Ectopic      Multiple      Live Births               Home Medications    Prior to Admission medications   Medication Sig Start Date End Date Taking? Authorizing Provider  hydrocortisone (ANUSOL-HC) 2.5 % rectal cream Apply rectally 2 times daily 08/22/18   Cathie Hoops, Amy V, PA-C  ibuprofen (ADVIL) 600 MG tablet Take 1 tablet (600 mg total) by mouth every 8 (eight) hours as needed for moderate pain. 08/11/20   Janace Aris, NP  levocetirizine (XYZAL) 5 MG tablet Take 1 tablet (5 mg total) by mouth every evening. 01/21/20   [name], PA-C  Lidocaine 0.5 % GEL 1 application 3-4 times a day. Do not exceed 4 applications a day. 08/22/18   Belinda Fisher, PA-C  meclizine (ANTIVERT) 12.5 MG tablet Take 1 tablet (12.5 mg total) by mouth 3 (three) times daily as needed for dizziness. 01/21/20   [name], PA-C  naproxen (NAPROSYN) 500 MG tablet Take  1 tablet (500 mg total) by mouth 2 (two) times daily. 01/21/20   [name], PA-C  naproxen (NAPROSYN) 500 MG tablet Take 1 tablet (500 mg total) by mouth 2 (two) times daily. 01/21/20   [name], PA-C  ondansetron (ZOFRAN ODT) 4 MG disintegrating tablet Take 1 tablet (4 mg total) by mouth every 8 (eight) hours as needed for nausea or vomiting. 08/11/20   Saiquan Hands, Gloris Manchester A, NP  ondansetron (ZOFRAN) 4 MG tablet Take 1 tablet (4 mg total) by mouth every 8 (eight) hours as needed for nausea or vomiting. 12/04/18   [name], MD  promethazine (PHENERGAN) 25 MG tablet Take 0.5-1 tablets (12.5-25 mg total) by mouth every 6 (six) hours as needed. 08/14/18   Thressa Sheller D, CNM  sertraline (ZOLOFT) 25 MG tablet Take 1 tablet (25 mg total) by mouth daily. Patient taking differently: Take 25 mg by mouth daily. Pt not taking 08/21/18   [name], MD  sertraline (ZOLOFT) 25 MG tablet Take 1  tablet (25 mg total) by mouth daily. Patient taking differently: Take 25 mg by mouth daily. Pt not taking 12/04/18   [name], MD    Family History Family History  Family history unknown: Yes    Social History Social History   Tobacco Use  . Smoking status: Never Smoker  . Smokeless tobacco: Never Used  Vaping Use  . Vaping Use: Never used  Substance Use Topics  . Alcohol use: Not Currently  . Drug use: Not Currently    Types: Marijuana     Allergies   Latex   Review of Systems Review of Systems   Physical Exam Triage Vital Signs ED Triage Vitals  Enc Vitals Group     BP 08/11/20 1406 109/67     Pulse Rate 08/11/20 1406 87     Resp 08/11/20 1406 18     Temp 08/11/20 1406 97.9 F (36.6 C)     Temp Source 08/11/20 1406 Oral     SpO2 08/11/20 1406 97 %     Weight --      Height --      Head Circumference --      Peak Flow --      Pain Score 08/11/20 1412 6     Pain Loc --      Pain Edu? --      Excl. in GC? --    No data found.  Updated Vital Signs BP 109/67 (BP  Location: Left Arm)   Pulse 87   Temp 97.9 F (36.6 C) (Oral)   Resp 18   LMP 07/29/2020   SpO2 97%   Visual Acuity Right Eye Distance:   Left Eye Distance:   Bilateral Distance:    Right Eye Near:   Left Eye Near:    Bilateral Near:     Physical Exam Vitals and nursing note reviewed.  Constitutional:      General: She is not in acute distress.    Appearance: Normal appearance. She is not ill-appearing, toxic-appearing or diaphoretic.  HENT:     Head: Normocephalic.     Nose: Nose normal.     Mouth/Throat:     Pharynx: Oropharynx is clear.  Eyes:     Conjunctiva/sclera: Conjunctivae normal.  Pulmonary:     Effort: Pulmonary effort is normal.  Abdominal:     Palpations: Abdomen is soft.     Tenderness: There is abdominal tenderness in the suprapubic area.  Musculoskeletal:        General: Normal range of motion.     Cervical back: Normal range of motion.     Lumbar back: Tenderness present.       Back:     Comments: ttp  Skin:    General: Skin is warm and dry.     Findings: No rash.  Neurological:     Mental Status: She is alert.  Psychiatric:        Mood and Affect: Mood normal.      UC Treatments / Results  Labs (all labs ordered are listed, but only abnormal results are displayed) Labs Reviewed  POCT URINALYSIS DIPSTICK, ED / UC - Abnormal; Notable for the following components:      Result Value   Ketones, ur TRACE (*)    Hgb urine dipstick LARGE (*)    Protein, ur 100 (*)    All other components within normal limits  URINE CULTURE  POC URINE PREG, ED  CERVICOVAGINAL ANCILLARY ONLY    EKG  Radiology No results found.  Procedures Procedures (including critical care time)  Medications Ordered in UC Medications  ketorolac (TORADOL) 30 MG/ML injection 30 mg (has no administration in time range)  ondansetron (ZOFRAN-ODT) disintegrating tablet 4 mg (has no administration in time range)    Initial Impression / Assessment and Plan / UC  Course  I have reviewed the triage vital signs and the nursing notes.  Pertinent labs & imaging results that were available during my care of the patient were reviewed by me and considered in my medical decision making (see chart for details).     Lower abdominal pain and vaginal bleeding.  Differentials include UTI versus [place] versus ovarian cyst versus irregular menstrual bleeding versus miscarriage or tubal pregnancy. Pregnancy test negative here today.  Urine with large blood mostly from vaginal bleeding.  Sending for culture. Mostly concerned for ruptured ovarian cyst.  Treated here today with Toradol for pain and Zofran for nausea.  Will treat outpatient with Zofran as needed for nausea and ibuprofen for pain as needed.  Recommended for any continued or worsening symptoms she need to go to the ER for further evaluation and possible ultrasound. Final Clinical Impressions(s) / UC Diagnoses   Final diagnoses:  Lower abdominal pain  Vaginal bleeding     Discharge Instructions     Like we talked about this could be a kidney stone, ovarian cyst, vaginal infection, UTI. Sending your urine for culture. Sending swab for STD screening. Toradol given here for pain. Zofran for nausea.  Prescriptions sent to the pharmacy. Drink plenty of water.  ER for worsening symptoms.     ED Prescriptions    Medication Sig Dispense Auth. Provider   ondansetron (ZOFRAN ODT) 4 MG disintegrating tablet Take 1 tablet (4 mg total) by mouth every 8 (eight) hours as needed for nausea or vomiting. 20 tablet Taydem Cavagnaro A, NP   ibuprofen (ADVIL) 600 MG tablet Take 1 tablet (600 mg total) by mouth every 8 (eight) hours as needed for moderate pain. 30 tablet Dahlia Byes A, NP     PDMP not reviewed this encounter.   Janace Aris, NP 08/11/20 1505

## 2020-08-12 LAB — URINE CULTURE: Culture: 50000 — AB

## 2020-08-14 LAB — CERVICOVAGINAL ANCILLARY ONLY
Bacterial Vaginitis (gardnerella): NEGATIVE
Candida Glabrata: NEGATIVE
Candida Vaginitis: NEGATIVE
Chlamydia: NEGATIVE
Comment: NEGATIVE
Comment: NEGATIVE
Comment: NEGATIVE
Comment: NEGATIVE
Comment: NEGATIVE
Comment: NORMAL
Neisseria Gonorrhea: NEGATIVE
Trichomonas: NEGATIVE

## 2021-02-21 ENCOUNTER — Emergency Department (HOSPITAL_COMMUNITY)
Admission: EM | Admit: 2021-02-21 | Discharge: 2021-02-21 | Disposition: A | Discharge status: Home / Self Care | Payer: 59 | Attending: Emergency Medicine | Admitting: Emergency Medicine

## 2021-02-21 ENCOUNTER — Emergency Department (HOSPITAL_COMMUNITY): Payer: 59

## 2021-02-21 ENCOUNTER — Encounter (HOSPITAL_COMMUNITY): Payer: Self-pay | Admitting: *Deleted

## 2021-02-21 ENCOUNTER — Other Ambulatory Visit: Payer: Self-pay

## 2021-02-21 DIAGNOSIS — N939 Abnormal uterine and vaginal bleeding, unspecified: Secondary | ICD-10-CM | POA: Diagnosis not present

## 2021-02-21 DIAGNOSIS — M549 Dorsalgia, unspecified: Secondary | ICD-10-CM | POA: Insufficient documentation

## 2021-02-21 DIAGNOSIS — Z91040 Latex allergy status: Secondary | ICD-10-CM | POA: Insufficient documentation

## 2021-02-21 LAB — CBC WITH DIFFERENTIAL/PLATELET
Abs Immature Granulocytes: 0.01 10*3/uL (ref 0.00–0.07)
Basophils Absolute: 0 10*3/uL (ref 0.0–0.1)
Basophils Relative: 1 %
Eosinophils Absolute: 0.2 10*3/uL (ref 0.0–0.5)
Eosinophils Relative: 3 %
HCT: 42 % (ref 36.0–46.0)
Hemoglobin: 13.9 g/dL (ref 12.0–15.0)
Immature Granulocytes: 0 %
Lymphocytes Relative: 36 %
Lymphs Abs: 2.3 10*3/uL (ref 0.7–4.0)
MCH: 30.4 pg (ref 26.0–34.0)
MCHC: 33.1 g/dL (ref 30.0–36.0)
MCV: 91.9 fL (ref 80.0–100.0)
Monocytes Absolute: 0.4 10*3/uL (ref 0.1–1.0)
Monocytes Relative: 7 %
Neutro Abs: 3.4 10*3/uL (ref 1.7–7.7)
Neutrophils Relative %: 53 %
Platelets: 272 10*3/uL (ref 150–400)
RBC: 4.57 MIL/uL (ref 3.87–5.11)
RDW: 12.8 % (ref 11.5–15.5)
WBC: 6.3 10*3/uL (ref 4.0–10.5)
nRBC: 0 % (ref 0.0–0.2)

## 2021-02-21 LAB — URINALYSIS, ROUTINE W REFLEX MICROSCOPIC
Bacteria, UA: NONE SEEN
Bilirubin Urine: NEGATIVE
Glucose, UA: NEGATIVE mg/dL
Ketones, ur: NEGATIVE mg/dL
Leukocytes,Ua: NEGATIVE
Nitrite: NEGATIVE
Protein, ur: 30 mg/dL — AB
RBC / HPF: >50 RBC/hpf — ABNORMAL HIGH (ref 0–5)
Specific Gravity, Urine: 1.021 (ref 1.005–1.030)
pH: 8 (ref 5.0–8.0)

## 2021-02-21 LAB — COMPREHENSIVE METABOLIC PANEL
ALT: 12 U/L (ref 0–44)
AST: 15 U/L (ref 15–41)
Albumin: 3.7 g/dL (ref 3.5–5.0)
Alkaline Phosphatase: 61 U/L (ref 38–126)
Anion gap: 4 — ABNORMAL LOW (ref 5–15)
BUN: 15 mg/dL (ref 6–20)
CO2: 27 mmol/L (ref 22–32)
Calcium: 9 mg/dL (ref 8.9–10.3)
Chloride: 105 mmol/L (ref 98–111)
Creatinine, Ser: 0.58 mg/dL (ref 0.44–1.00)
GFR, Estimated: >60 mL/min (ref >60)
Glucose, Bld: 83 mg/dL (ref 70–99)
Potassium: 3.7 mmol/L (ref 3.5–5.1)
Sodium: 136 mmol/L (ref 135–145)
Total Bilirubin: 0.3 mg/dL (ref 0.3–1.2)
Total Protein: 7.2 g/dL (ref 6.5–8.1)

## 2021-02-21 LAB — I-STAT BETA HCG BLOOD, ED (MC, WL, AP ONLY): I-stat hCG, quantitative: <5 m[IU]/mL (ref <5)

## 2021-02-21 MED ORDER — TRAMADOL HCL 50 MG PO TABS: 50.0000 mg | ORAL_TABLET | Freq: Four times a day (QID) | ORAL | 0 refills | Status: AC | PRN

## 2021-02-21 MED ORDER — HYDROMORPHONE HCL 1 MG/ML IJ SOLN
0.5000 mg | Freq: Once | INTRAMUSCULAR | Status: AC
Start: 2021-02-21 — End: 2021-02-21
  Administered 2021-02-21: 0.5 mg via INTRAVENOUS
  Filled 2021-02-21: qty 1

## 2021-02-21 MED ORDER — ONDANSETRON HCL 4 MG/2ML IJ SOLN
4.0000 mg | Freq: Once | INTRAMUSCULAR | Status: AC
  Administered 2021-02-21: 4 mg via INTRAVENOUS
  Filled 2021-02-21: qty 2

## 2021-02-21 NOTE — ED Triage Notes (Signed)
Pt reports vaginal bleeding since yesterday, left lower abdominal cramping, became worse over night. Started having pain in her back last night. Pt had bleeding 1 month ago, was tested for BV and GC, was negative.Marland KitchenLMP 5 days ago.

## 2021-02-21 NOTE — ED Notes (Signed)
Written discharge instructions provided and reviewed.  The patient verbalizes understanding

## 2021-02-21 NOTE — Discharge Instructions (Addendum)
Follow-up with your OB/GYN doctor next week for recheck

## 2021-02-21 NOTE — ED Provider Notes (Signed)
[facility]-EMERGENCY DEPT Provider Note   CSN: [id number] Arrival date & time: 02/21/21  1431     History Chief Complaint  Patient presents with  . Vaginal Bleeding  . Back Pain    Sonia Miller is a 25 y.o. female.  Patient complains of painful irregular heavy vaginal bleeding.  No fever no chills no cough.  She was seen recently and had pelvic exam done with cultures which were negative  The history is provided by medical records. No language interpreter was used.  Vaginal Bleeding Quality:  Bright red Severity:  Moderate Onset quality:  Sudden Timing:  Constant Progression:  Waxing and waning Chronicity:  New Menstrual history:  Irregular Possible pregnancy: no   Associated symptoms: back pain   Associated symptoms: no abdominal pain and no fatigue   Back Pain Associated symptoms: no abdominal pain, no chest pain and no headaches        Past Medical History:  Diagnosis Date  . Anxiety   . Depression     There are no problems to display for this patient.   Past Surgical History:  Procedure Laterality Date  . NO PAST SURGERIES       OB History    Gravida  1   Para      Term      Preterm      AB      Living        SAB      IAB      Ectopic      Multiple      Live Births              Family History  Family history unknown: Yes    Social History   Tobacco Use  . Smoking status: Never Smoker  . Smokeless tobacco: Never Used  Vaping Use  . Vaping Use: Never used  Substance Use Topics  . Alcohol use: Not Currently  . Drug use: Not Currently    Types: Marijuana    Home Medications Prior to Admission medications   Medication Sig Start Date End Date Taking? Authorizing Provider  traMADol (ULTRAM) 50 MG tablet Take 1 tablet (50 mg total) by mouth every 6 (six) hours as needed. 02/21/21  Yes [name], MD  hydrocortisone (ANUSOL-HC) 2.5 % rectal cream Apply rectally 2 times daily 08/22/18    Cathie Hoops, Amy V, PA-C  ibuprofen (ADVIL) 600 MG tablet Take 1 tablet (600 mg total) by mouth every 8 (eight) hours as needed for moderate pain. 08/11/20   Janace Aris, NP  levocetirizine (XYZAL) 5 MG tablet Take 1 tablet (5 mg total) by mouth every evening. 01/21/20   [name], PA-C  Lidocaine 0.5 % GEL 1 application 3-4 times a day. Do not exceed 4 applications a day. 08/22/18   Belinda Fisher, PA-C  meclizine (ANTIVERT) 12.5 MG tablet Take 1 tablet (12.5 mg total) by mouth 3 (three) times daily as needed for dizziness. 01/21/20   [name], PA-C  naproxen (NAPROSYN) 500 MG tablet Take 1 tablet (500 mg total) by mouth 2 (two) times daily. 01/21/20   [name], PA-C  naproxen (NAPROSYN) 500 MG tablet Take 1 tablet (500 mg total) by mouth 2 (two) times daily. 01/21/20   [name], PA-C  ondansetron (ZOFRAN ODT) 4 MG disintegrating tablet Take 1 tablet (4 mg total) by mouth every 8 (eight) hours as needed for nausea or vomiting. 08/11/20   Janace Aris, NP  ondansetron (ZOFRAN) 4 MG tablet Take 1 tablet (4 mg total) by mouth every 8 (eight) hours as needed for nausea or vomiting. 12/04/18   [name], MD  promethazine (PHENERGAN) 25 MG tablet Take 0.5-1 tablets (12.5-25 mg total) by mouth every 6 (six) hours as needed. 08/14/18   Thressa Sheller D, CNM  sertraline (ZOLOFT) 25 MG tablet Take 1 tablet (25 mg total) by mouth daily. Patient taking differently: Take 25 mg by mouth daily. Pt not taking 08/21/18   [name], MD  sertraline (ZOLOFT) 25 MG tablet Take 1 tablet (25 mg total) by mouth daily. Patient taking differently: Take 25 mg by mouth daily. Pt not taking 12/04/18   [name], MD    Allergies    Latex  Review of Systems   Review of Systems  Constitutional: Negative for appetite change and fatigue.  HENT: Negative for congestion, ear discharge and sinus pressure.   Eyes: Negative for discharge.  Respiratory: Negative for cough.   Cardiovascular: Negative for chest pain.   Gastrointestinal: Negative for abdominal pain and diarrhea.  Genitourinary: Positive for vaginal bleeding. Negative for frequency and hematuria.  Musculoskeletal: Positive for back pain.  Skin: Negative for rash.  Neurological: Negative for seizures and headaches.  Psychiatric/Behavioral: Negative for hallucinations.    Physical Exam Updated Vital Signs BP 107/68   Pulse 71   Temp 98.6 F (37 C) (Oral)   Resp 18   LMP 02/21/2021   SpO2 99%   Physical Exam Vitals and nursing note reviewed.  Constitutional:      Appearance: She is well-developed.  HENT:     Head: Normocephalic.     Nose: Nose normal.  Eyes:     General: No scleral icterus.    Conjunctiva/sclera: Conjunctivae normal.  Neck:     Thyroid: No thyromegaly.  Cardiovascular:     Rate and Rhythm: Normal rate and regular rhythm.     Heart sounds: No murmur heard. No friction rub. No gallop.   Pulmonary:     Breath sounds: No stridor. No wheezing or rales.  Chest:     Chest wall: No tenderness.  Abdominal:     General: There is no distension.     Tenderness: There is abdominal tenderness. There is no rebound.     Comments: Left lower quadrant minimal tenderness  Musculoskeletal:        General: Normal range of motion.     Cervical back: Neck supple.  Lymphadenopathy:     Cervical: No cervical adenopathy.  Skin:    Findings: No erythema or rash.  Neurological:     Mental Status: She is alert and oriented to person, place, and time.     Motor: No abnormal muscle tone.     Coordination: Coordination normal.  Psychiatric:        Behavior: Behavior normal.     ED Results / Procedures / Treatments   Labs (all labs ordered are listed, but only abnormal results are displayed) Labs Reviewed  COMPREHENSIVE METABOLIC PANEL - Abnormal; Notable for the following components:      Result Value   Anion gap 4 (*)    All other components within normal limits  URINALYSIS, ROUTINE W REFLEX MICROSCOPIC - Abnormal;  Notable for the following components:   Color, Urine YELLOW (*)    APPearance CLOUDY (*)    Hgb urine dipstick LARGE (*)    Protein, ur 30 (*)    RBC / HPF >50 (*)  All other components within normal limits  URINE CULTURE  CBC WITH DIFFERENTIAL/PLATELET  I-STAT BETA HCG BLOOD, ED ([facility], [facility], [facility] ONLY)    EKG None  Radiology US Pelvis Complete  Result Date: 02/21/2021 CLINICAL DATA:  Acute pelvic pain and bleeding. EXAM: TRANSABDOMINAL ULTRASOUND OF PELVIS TECHNIQUE: Transabdominal ultrasound examination of the pelvis was performed including evaluation of the uterus, ovaries, adnexal regions, and pelvic cul-de-sac. COMPARISON:  None. FINDINGS: Uterus Measurements: 7.1 x 4.3 x 3.2 cm = volume: 51 mL. No fibroids or other mass visualized. Endometrium Thickness: 6 mm which is within normal limits. No focal abnormality visualized. Right ovary Measurements: 3.0 x 2.3 x 1.4 cm = volume: 5 mL. Normal appearance/no adnexal mass. Left ovary Measurements: 2.3 x 2.3 x 2.0 cm = volume: 6 mL. Normal appearance/no adnexal mass. Other findings:  No abnormal free fluid. IMPRESSION: No abnormality seen in the pelvis. Electronically Signed   By: Lupita Raider M.D.   On: 02/21/2021 18:25    Procedures Procedures   Medications Ordered in ED Medications  HYDROmorphone (DILAUDID) injection 0.5 mg (0.5 mg Intravenous Given 02/21/21 1621)  ondansetron (ZOFRAN) injection 4 mg (4 mg Intravenous Given 02/21/21 1621)    ED Course  I have reviewed the triage vital signs and the nursing notes.  Pertinent labs & imaging results that were available during my care of the patient were reviewed by me and considered in my medical decision making (see chart for details).    MDM Rules/Calculators/A&P                          Labs and ultrasound unremarkable.  Patient with irregular and painful menses.  She is given some pain medicines and will follow up with her ob-gyn md Final Clinical Impression(s) / ED  Diagnoses Final diagnoses:  Vaginal bleeding    Rx / DC Orders ED Discharge Orders         Ordered    traMADol (ULTRAM) 50 MG tablet  Every 6 hours PRN        02/21/21 1833           [name], MD 02/21/21 1835

## 2021-02-23 LAB — URINE CULTURE: Culture: <10000 — AB

## 2022-01-20 IMAGING — US US PELVIS COMPLETE
1 series · 15 of 25 positions shown · non-contrast
Comparison: None.

CLINICAL DATA: Acute pelvic pain and bleeding.

EXAM:
TRANSABDOMINAL ULTRASOUND OF PELVIS
TECHNIQUE: Transabdominal ultrasound examination of the pelvis was performed
including evaluation of the uterus, ovaries, adnexal regions, and
pelvic cul-de-sac.

[Series 1: us pelvis complete [facility] · 46 acquisitions, 15 frames shown]
[im 1/46]
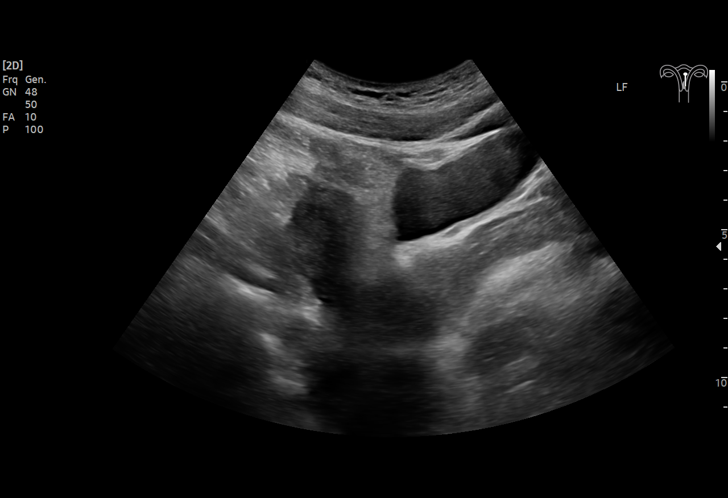
[im 4/46]
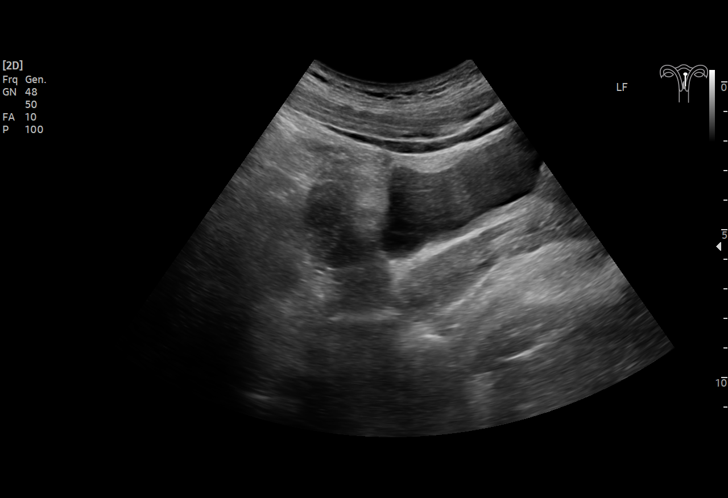
[im 8/46]
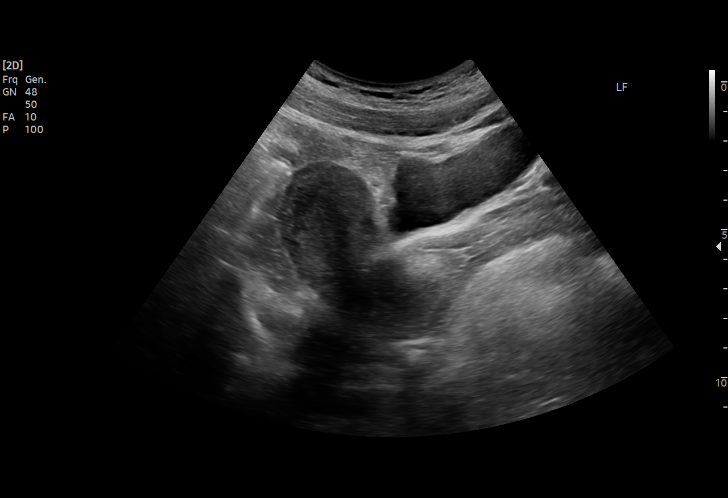
[im 10/46]
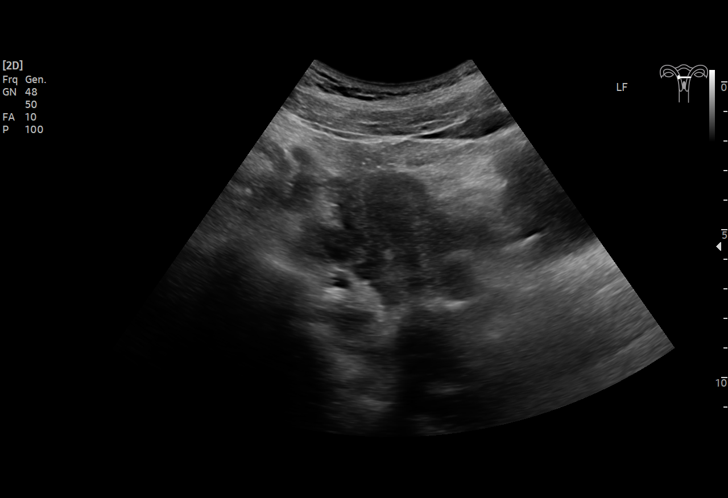
[im 14/46]
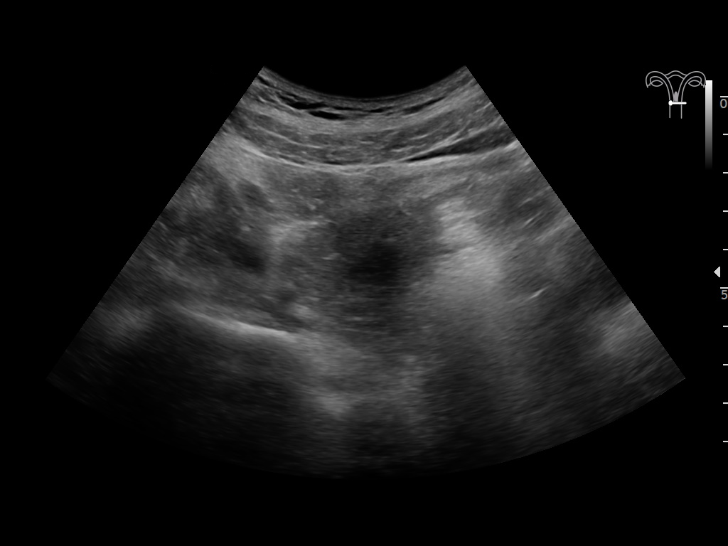
[im 17/46]
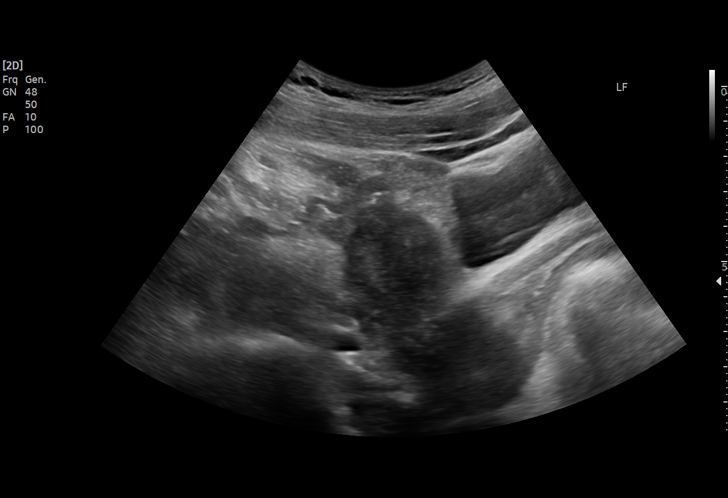
[im 19/46]
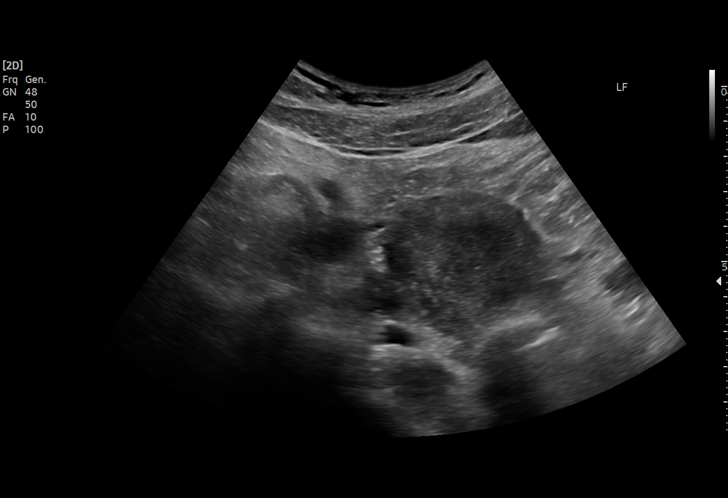
[im 23/46]
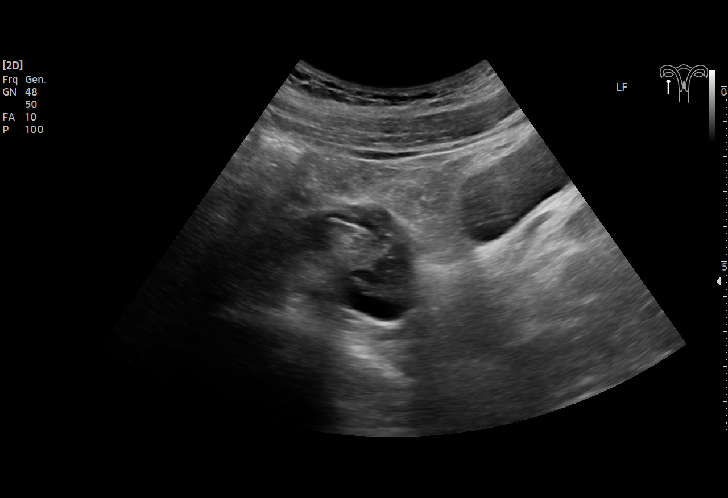
[im 27/46]
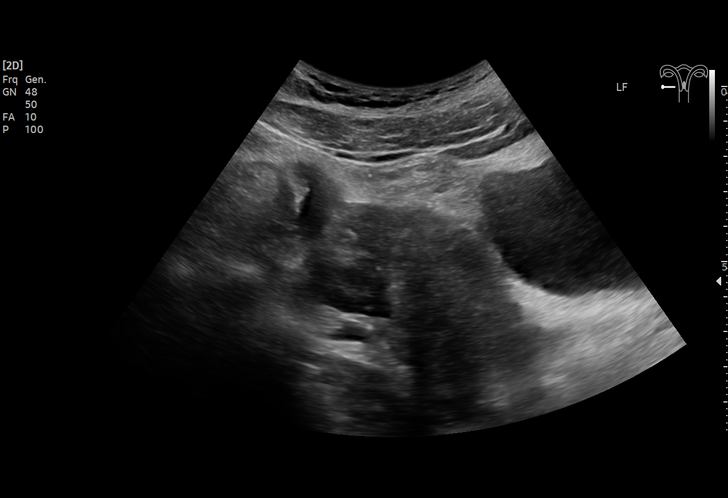
[im 29/46]
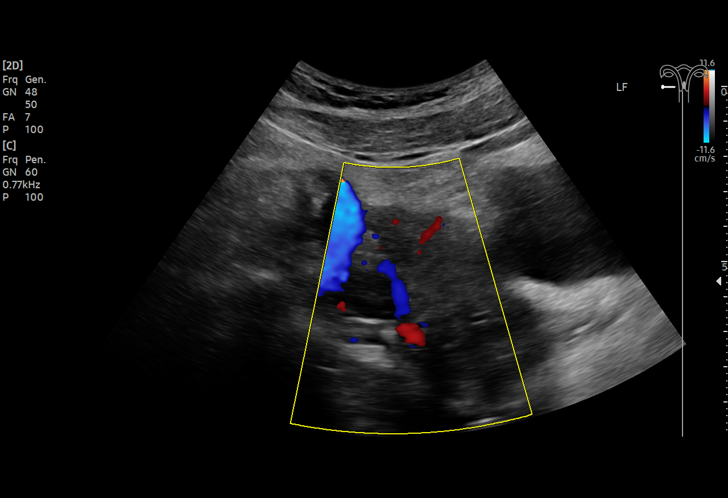
[im 32/46]
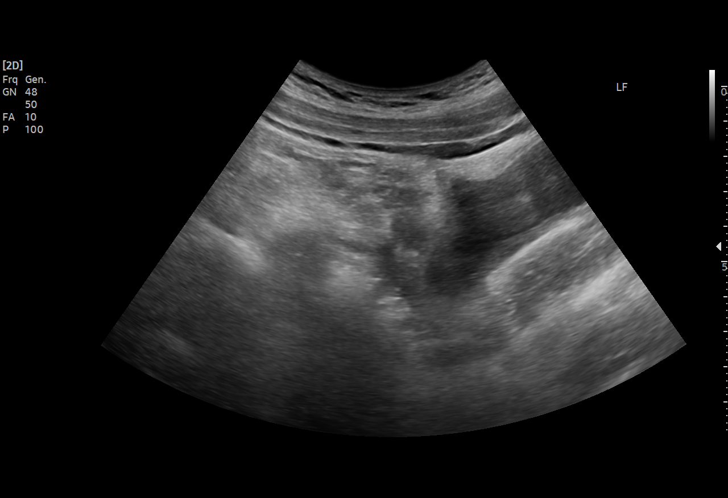
[im 36/46]
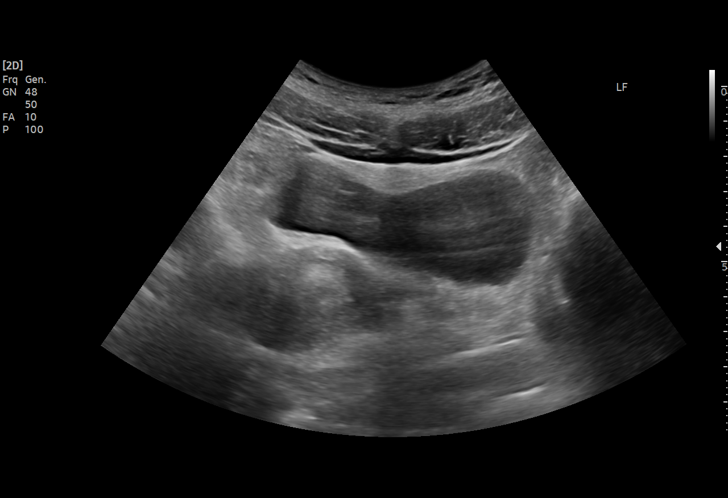
[im 38/46]
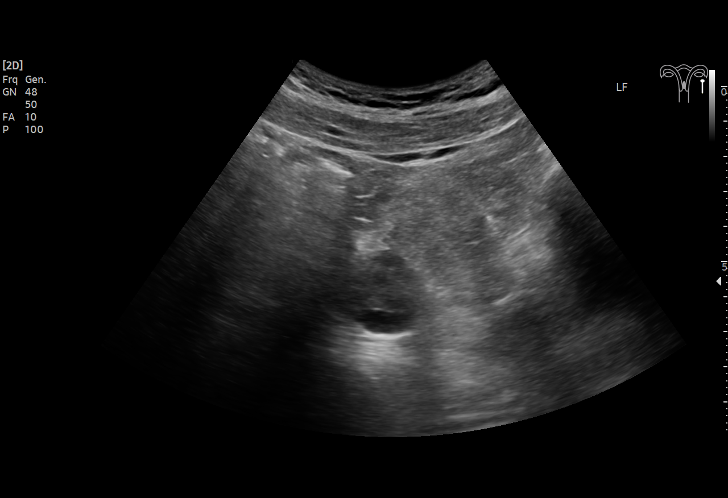
[im 42/46]
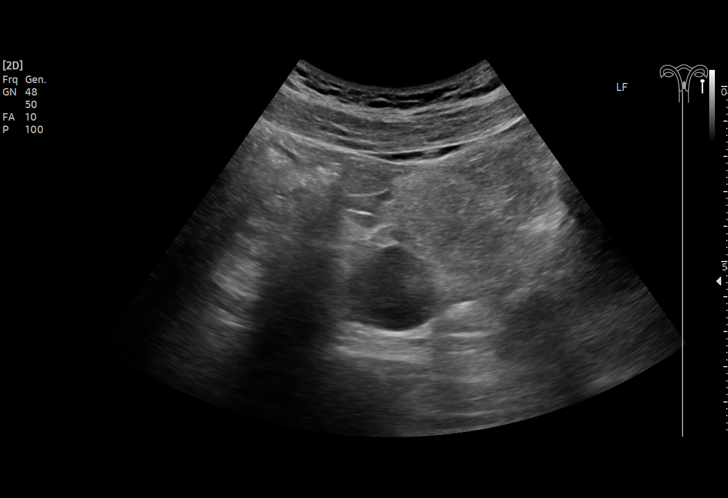
[im 46/46]
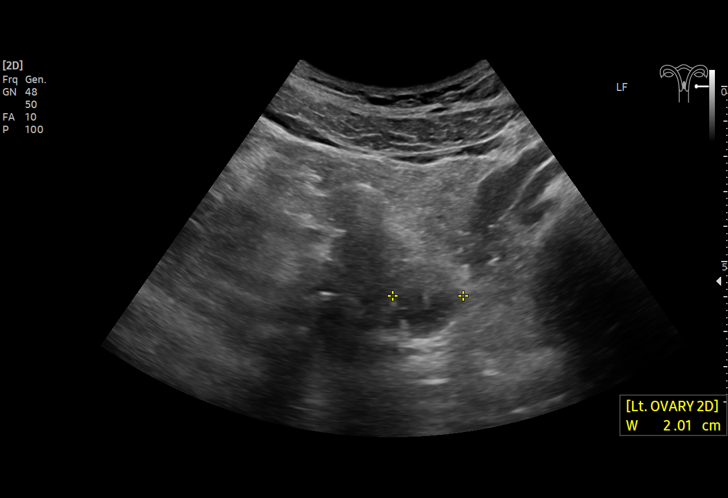

[15 of 25 positions shown; findings below may reference images not displayed]

FINDINGS: Uterus

Measurements: 7.1 x 4.3 x 3.2 cm = volume: 51 mL. No fibroids or
other mass visualized.

Endometrium

Thickness: 6 mm which is within normal limits. No focal abnormality
visualized.

Right ovary

Measurements: 3.0 x 2.3 x 1.4 cm = volume: 5 mL. Normal
appearance/no adnexal mass.

Left ovary

Measurements: 2.3 x 2.3 x 2.0 cm = volume: 6 mL. Normal
appearance/no adnexal mass.

Other findings:  No abnormal free fluid.
IMPRESSION: No abnormality seen in the pelvis.

## 2022-05-03 ENCOUNTER — Other Ambulatory Visit: Payer: Self-pay

## 2022-05-03 ENCOUNTER — Emergency Department (HOSPITAL_BASED_OUTPATIENT_CLINIC_OR_DEPARTMENT_OTHER): Payer: Self-pay | Admitting: Radiology

## 2022-05-03 ENCOUNTER — Encounter (HOSPITAL_BASED_OUTPATIENT_CLINIC_OR_DEPARTMENT_OTHER): Payer: Self-pay

## 2022-05-03 ENCOUNTER — Emergency Department (HOSPITAL_BASED_OUTPATIENT_CLINIC_OR_DEPARTMENT_OTHER)
Admission: EM | Admit: 2022-05-03 | Discharge: 2022-05-04 | Disposition: A | Discharge status: Home / Self Care | Payer: Self-pay | Attending: Emergency Medicine | Admitting: Emergency Medicine

## 2022-05-03 DIAGNOSIS — R0602 Shortness of breath: Secondary | ICD-10-CM | POA: Insufficient documentation

## 2022-05-03 DIAGNOSIS — R0789 Other chest pain: Secondary | ICD-10-CM | POA: Insufficient documentation

## 2022-05-03 DIAGNOSIS — R519 Headache, unspecified: Secondary | ICD-10-CM | POA: Insufficient documentation

## 2022-05-03 DIAGNOSIS — R42 Dizziness and giddiness: Secondary | ICD-10-CM | POA: Insufficient documentation

## 2022-05-03 DIAGNOSIS — R112 Nausea with vomiting, unspecified: Secondary | ICD-10-CM | POA: Insufficient documentation

## 2022-05-03 DIAGNOSIS — Z91040 Latex allergy status: Secondary | ICD-10-CM | POA: Insufficient documentation

## 2022-05-03 DIAGNOSIS — R059 Cough, unspecified: Secondary | ICD-10-CM | POA: Insufficient documentation

## 2022-05-03 LAB — CBC
HCT: 40.2 % (ref 36.0–46.0)
Hemoglobin: 13.6 g/dL (ref 12.0–15.0)
MCH: 30.9 pg (ref 26.0–34.0)
MCHC: 33.8 g/dL (ref 30.0–36.0)
MCV: 91.4 fL (ref 80.0–100.0)
Platelets: 308 10*3/uL (ref 150–400)
RBC: 4.4 MIL/uL (ref 3.87–5.11)
RDW: 12.5 % (ref 11.5–15.5)
WBC: 9.3 10*3/uL (ref 4.0–10.5)
nRBC: 0 % (ref 0.0–0.2)

## 2022-05-03 LAB — TROPONIN I (HIGH SENSITIVITY)
Troponin I (High Sensitivity): <2 ng/L (ref <18)
Troponin I (High Sensitivity): <2 ng/L (ref <18)

## 2022-05-03 LAB — BASIC METABOLIC PANEL
Anion gap: 12 (ref 5–15)
BUN: 21 mg/dL — ABNORMAL HIGH (ref 6–20)
CO2: 21 mmol/L — ABNORMAL LOW (ref 22–32)
Calcium: 9.1 mg/dL (ref 8.9–10.3)
Chloride: 102 mmol/L (ref 98–111)
Creatinine, Ser: 0.88 mg/dL (ref 0.44–1.00)
GFR, Estimated: >60 mL/min (ref >60)
Glucose, Bld: 93 mg/dL (ref 70–99)
Potassium: 3.4 mmol/L — ABNORMAL LOW (ref 3.5–5.1)
Sodium: 135 mmol/L (ref 135–145)

## 2022-05-03 LAB — PREGNANCY, URINE: Preg Test, Ur: NEGATIVE

## 2022-05-03 MED ORDER — LACTATED RINGERS IV BOLUS
1000.0000 mL | Freq: Once | INTRAVENOUS | Status: AC
  Administered 2022-05-03: 1000 mL via INTRAVENOUS

## 2022-05-03 MED ORDER — METOCLOPRAMIDE HCL 5 MG/ML IJ SOLN
10.0000 mg | Freq: Once | INTRAMUSCULAR | Status: AC
  Administered 2022-05-03: 10 mg via INTRAVENOUS
  Filled 2022-05-03: qty 2

## 2022-05-03 MED ORDER — KETOROLAC TROMETHAMINE 15 MG/ML IJ SOLN
15.0000 mg | Freq: Once | INTRAMUSCULAR | Status: AC
  Administered 2022-05-03: 15 mg via INTRAVENOUS
  Filled 2022-05-03: qty 1

## 2022-05-03 NOTE — ED Triage Notes (Signed)
Pt presents with headache,  mid-sternum chest tightness, episodes of feeling like she is going to pass out but has not "completely passed out. Pt reports her symptoms started yesterday at 1pm   No neuro deficits, ambulatory to triage

## 2022-05-03 NOTE — ED Provider Notes (Signed)
[facility] EMERGENCY DEPT Provider Note   CSN: [id number] Arrival date & time: 05/03/22  1827     History  Chief Complaint  Patient presents with   Chest Pain   Blurred Vision    Sonia Miller is a 26 y.o. female.  26 yo F with hx of migraines who presents with headache, n/v, dizziness, and chest pain. States that last yesterday she started experiencing a headache that was occipital and associated with dizziness. Feels that it radiates down her neck. Says it is worse with movement. Worse with sound but not light. Says that she has been nauseous with vomiting last night. Says that she has felt light headed but has not passed out at all. Also started developing chest pressure in that time that is substernal and worse with position and walking. No diaphoresis or radiation. Had a cold that she is now getting over that started 2 weeks ago.    Chest Pain Associated symptoms: cough, headache and shortness of breath   Associated symptoms: no dysphagia, no fever, no nausea, no numbness, no vomiting and no weakness        Home Medications Prior to Admission medications   Medication Sig Start Date End Date Taking? Authorizing Provider  hydrocortisone (ANUSOL-HC) 2.5 % rectal cream Apply rectally 2 times daily 08/22/18   Cathie Hoops, Amy V, PA-C  ibuprofen (ADVIL) 600 MG tablet Take 1 tablet (600 mg total) by mouth every 8 (eight) hours as needed for moderate pain. 08/11/20   Janace Aris, NP  levocetirizine (XYZAL) 5 MG tablet Take 1 tablet (5 mg total) by mouth every evening. 01/21/20   [name], PA-C  Lidocaine 0.5 % GEL 1 application 3-4 times a day. Do not exceed 4 applications a day. 08/22/18   Belinda Fisher, PA-C  meclizine (ANTIVERT) 12.5 MG tablet Take 1 tablet (12.5 mg total) by mouth 3 (three) times daily as needed for dizziness. 01/21/20   [name], PA-C  naproxen (NAPROSYN) 500 MG tablet Take 1 tablet (500 mg total) by mouth 2 (two) times daily. 01/21/20   [name], PA-C  naproxen (NAPROSYN) 500 MG tablet Take 1 tablet (500 mg total) by mouth 2 (two) times daily. 01/21/20   [name], PA-C  ondansetron (ZOFRAN ODT) 4 MG disintegrating tablet Take 1 tablet (4 mg total) by mouth every 8 (eight) hours as needed for nausea or vomiting. 08/11/20   Bast, Gloris Manchester A, NP  ondansetron (ZOFRAN) 4 MG tablet Take 1 tablet (4 mg total) by mouth every 8 (eight) hours as needed for nausea or vomiting. 12/04/18   [name], MD  promethazine (PHENERGAN) 25 MG tablet Take 0.5-1 tablets (12.5-25 mg total) by mouth every 6 (six) hours as needed. 08/14/18   Thressa Sheller D, CNM  sertraline (ZOLOFT) 25 MG tablet Take 1 tablet (25 mg total) by mouth daily. Patient taking differently: Take 25 mg by mouth daily. Pt not taking 08/21/18   [name], MD  sertraline (ZOLOFT) 25 MG tablet Take 1 tablet (25 mg total) by mouth daily. Patient taking differently: Take 25 mg by mouth daily. Pt not taking 12/04/18   [name], MD  traMADol (ULTRAM) 50 MG tablet Take 1 tablet (50 mg total) by mouth every 6 (six) hours as needed. 02/21/21   [name], MD      Allergies    Latex    Review of Systems   Review of Systems  Constitutional:  Negative for fever.  HENT:  Positive for congestion. Negative for ear pain, hearing loss, tinnitus and trouble swallowing.   Eyes:  Negative for visual disturbance.  Respiratory:  Positive for cough and shortness of breath.   Cardiovascular:  Positive for chest pain. Negative for leg swelling.  Gastrointestinal:  Negative for nausea and vomiting.  Musculoskeletal:  Negative for neck stiffness.  Neurological:  Positive for light-headedness and headaches. Negative for weakness and numbness.    Physical Exam Updated Vital Signs BP 111/78 (BP Location: Right Arm)   Pulse 79   Temp 97.6 F (36.4 C)   Resp 18   SpO2 100%  Physical Exam  ED Results / Procedures / Treatments   Labs (all labs ordered are listed, but only  abnormal results are displayed) Labs Reviewed  BASIC METABOLIC PANEL - Abnormal; Notable for the following components:      Result Value   Potassium 3.4 (*)    CO2 21 (*)    BUN 21 (*)    All other components within normal limits  CBC  PREGNANCY, URINE  TROPONIN I (HIGH SENSITIVITY)  TROPONIN I (HIGH SENSITIVITY)    EKG EKG Interpretation  Date/Time:  Friday May 03 2022 18:40:07 EDT Ventricular Rate:  75 PR Interval:  144 QRS Duration: 74 QT Interval:  372 QTC Calculation: 415 R Axis:   82 Text Interpretation: Sinus rhythm with marked sinus arrhythmia Otherwise normal ECG When compared with ECG of 17-Nov-2017 21:12, No significant change was found Confirmed by Vonita Moss ([id number]) on 05/03/2022 9:33:40 PM  Radiology DG Chest 2 View  Result Date: 05/03/2022 CLINICAL DATA:  Chest pain EXAM: CHEST - 2 VIEW COMPARISON:  11/17/2017 FINDINGS: Heart and mediastinal contours are within normal limits. No focal opacities or effusions. No acute bony abnormality. IMPRESSION: No active cardiopulmonary disease. Electronically Signed   By: Charlett Nose M.D.   On: 05/03/2022 19:53    Procedures Procedures    Medications Ordered in ED Medications - No data to display  ED Course/ Medical Decision Making/ A&P                           Medical Decision Making 26 yo F with hx of migraines who presents with headache, n/v, dizziness, and chest pain. Per pt headaches appear different than usual migraines. Her exam was reassuring and she was given a migraine cocktail. Stated that she still had not had relief and was persistently dizzy. With the pain radiating down the back of her neck a CTA of the head and neck was obtained to evaluate for possible vertebral dissection and is pending at this time. Her chest pain appears to be related to her headache and does not have any features that are totally consistent with cardiac or pulmonary causes. Her troponins sent from triage were unremarkable x2. EKG  without concerning findings. PERC negative. Given this reassuring workup feel that life threatening causes are highly unlikely. Pt was then signed out to the oncoming physician awaiting head CT.   Amount and/or Complexity of Data Reviewed Labs: ordered. Radiology: ordered.  Risk OTC drugs. Prescription drug management.    Final Clinical Impression(s) / ED Diagnoses Final diagnoses:  None    Rx / DC Orders ED Discharge Orders     None         Rondel Baton, MD 05/05/22 1037

## 2022-05-04 ENCOUNTER — Emergency Department (HOSPITAL_BASED_OUTPATIENT_CLINIC_OR_DEPARTMENT_OTHER): Payer: Self-pay

## 2022-05-04 MED ORDER — IOHEXOL 350 MG/ML SOLN
100.0000 mL | Freq: Once | INTRAVENOUS | Status: AC | PRN
  Administered 2022-05-04: 75 mL via INTRAVENOUS

## 2022-05-04 MED ORDER — ACETAMINOPHEN 500 MG PO TABS
1000.0000 mg | ORAL_TABLET | ORAL | Status: AC
  Administered 2022-05-04: 1000 mg via ORAL
  Filled 2022-05-04: qty 2

## 2022-05-04 MED ORDER — POTASSIUM CHLORIDE CRYS ER 20 MEQ PO TBCR
40.0000 meq | EXTENDED_RELEASE_TABLET | Freq: Once | ORAL | Status: AC
  Administered 2022-05-04: 40 meq via ORAL
  Filled 2022-05-04: qty 2

## 2022-05-04 NOTE — Discharge Instructions (Signed)
Continue Tylenol 1000 mg rotated with ibuprofen 600 mg every 4 hours as needed for pain.  Drink plenty of fluids and get plenty of rest.  Follow-up with primary doctor if not improving in the next few days, and return to the ER if symptoms significantly worsen or change.

## 2022-05-04 NOTE — ED Provider Notes (Signed)
  Physical Exam  BP 115/71   Pulse 71   Temp 98 F (36.7 C) (Oral)   Resp 18   SpO2 100%   Physical Exam Vitals and nursing note reviewed.  Constitutional:      General: She is not in acute distress.    Appearance: She is well-developed. She is not diaphoretic.  HENT:     Head: Normocephalic and atraumatic.  Cardiovascular:     Heart sounds: No murmur heard.    No friction rub. No gallop.  Pulmonary:     Effort: Pulmonary effort is normal.  Abdominal:     General: There is no distension.  Musculoskeletal:        General: Normal range of motion.     Cervical back: Normal range of motion and neck supple.  Skin:    General: Skin is warm and dry.  Neurological:     Mental Status: She is alert and oriented to person, place, and time.     Procedures  Procedures  ED Course / MDM  Care assumed from Dr. Eloise Harman at shift change.  Patient awaiting results of CTA of the head and neck.  This study has returned and is unremarkable.  Patient states that she is feeling somewhat better after receiving fluids and medications.  She feels comfortable with going home.  I will have her continue Tylenol and Motrin and return as needed if symptoms worsen or change.      Geoffery Lyons, MD 05/04/22 [phone]

## 2022-11-02 ENCOUNTER — Encounter (HOSPITAL_COMMUNITY): Payer: Self-pay | Admitting: *Deleted

## 2022-11-02 ENCOUNTER — Other Ambulatory Visit: Payer: Self-pay

## 2022-11-02 ENCOUNTER — Emergency Department (HOSPITAL_COMMUNITY)
Admission: EM | Admit: 2022-11-02 | Discharge: 2022-11-02 | Disposition: A | Discharge status: Home / Self Care | Payer: 59 | Attending: Emergency Medicine | Admitting: Emergency Medicine

## 2022-11-02 DIAGNOSIS — Z91040 Latex allergy status: Secondary | ICD-10-CM | POA: Insufficient documentation

## 2022-11-02 DIAGNOSIS — Z3A00 Weeks of gestation of pregnancy not specified: Secondary | ICD-10-CM | POA: Diagnosis not present

## 2022-11-02 DIAGNOSIS — O26899 Other specified pregnancy related conditions, unspecified trimester: Secondary | ICD-10-CM | POA: Insufficient documentation

## 2022-11-02 DIAGNOSIS — R112 Nausea with vomiting, unspecified: Secondary | ICD-10-CM | POA: Insufficient documentation

## 2022-11-02 DIAGNOSIS — Z3490 Encounter for supervision of normal pregnancy, unspecified, unspecified trimester: Secondary | ICD-10-CM

## 2022-11-02 DIAGNOSIS — R1110 Vomiting, unspecified: Secondary | ICD-10-CM | POA: Diagnosis present

## 2022-11-02 LAB — CBC WITH DIFFERENTIAL/PLATELET
Abs Immature Granulocytes: 0.03 10*3/uL (ref 0.00–0.07)
Basophils Absolute: 0.1 10*3/uL (ref 0.0–0.1)
Basophils Relative: 1 %
Eosinophils Absolute: 0.3 10*3/uL (ref 0.0–0.5)
Eosinophils Relative: 3 %
HCT: 40.4 % (ref 36.0–46.0)
Hemoglobin: 14 g/dL (ref 12.0–15.0)
Immature Granulocytes: 0 %
Lymphocytes Relative: 30 %
Lymphs Abs: 2.6 10*3/uL (ref 0.7–4.0)
MCH: 31.2 pg (ref 26.0–34.0)
MCHC: 34.7 g/dL (ref 30.0–36.0)
MCV: 90 fL (ref 80.0–100.0)
Monocytes Absolute: 0.6 10*3/uL (ref 0.1–1.0)
Monocytes Relative: 7 %
Neutro Abs: 5.1 10*3/uL (ref 1.7–7.7)
Neutrophils Relative %: 59 %
Platelets: 303 10*3/uL (ref 150–400)
RBC: 4.49 MIL/uL (ref 3.87–5.11)
RDW: 12.4 % (ref 11.5–15.5)
WBC: 8.7 10*3/uL (ref 4.0–10.5)
nRBC: 0 % (ref 0.0–0.2)

## 2022-11-02 LAB — COMPREHENSIVE METABOLIC PANEL
ALT: 33 U/L (ref 0–44)
AST: 26 U/L (ref 15–41)
Albumin: 4.2 g/dL (ref 3.5–5.0)
Alkaline Phosphatase: 67 U/L (ref 38–126)
Anion gap: 7 (ref 5–15)
BUN: 11 mg/dL (ref 6–20)
CO2: 23 mmol/L (ref 22–32)
Calcium: 8.9 mg/dL (ref 8.9–10.3)
Chloride: 107 mmol/L (ref 98–111)
Creatinine, Ser: 0.76 mg/dL (ref 0.44–1.00)
GFR, Estimated: >60 mL/min (ref >60)
Glucose, Bld: 95 mg/dL (ref 70–99)
Potassium: 3.5 mmol/L (ref 3.5–5.1)
Sodium: 137 mmol/L (ref 135–145)
Total Bilirubin: 0.7 mg/dL (ref 0.3–1.2)
Total Protein: 7.4 g/dL (ref 6.5–8.1)

## 2022-11-02 LAB — HCG, QUANTITATIVE, PREGNANCY: hCG, Beta Chain, Quant, S: 57659 m[IU]/mL — ABNORMAL HIGH (ref <5)

## 2022-11-02 LAB — I-STAT BETA HCG BLOOD, ED (MC, WL, AP ONLY): I-stat hCG, quantitative: >2000 m[IU]/mL — ABNORMAL HIGH (ref <5)

## 2022-11-02 MED ORDER — ONDANSETRON HCL 4 MG/2ML IJ SOLN
4.0000 mg | Freq: Once | INTRAMUSCULAR | Status: AC
  Administered 2022-11-02: 4 mg via INTRAVENOUS
  Filled 2022-11-02: qty 2

## 2022-11-02 MED ORDER — PRENATAL VITAMIN 27-0.8 MG PO TABS: 1.0000 | ORAL_TABLET | Freq: Every day | ORAL | 0 refills | Status: AC

## 2022-11-02 MED ORDER — SODIUM CHLORIDE 0.9 % IV BOLUS
500.0000 mL | Freq: Once | INTRAVENOUS | Status: AC
  Administered 2022-11-02: 500 mL via INTRAVENOUS

## 2022-11-02 NOTE — ED Triage Notes (Signed)
Pt c/o emesis and diarrhea x 2 weeks. Has been told that she had a GI bug. Nausea unrelieved with antiemetics

## 2022-11-02 NOTE — ED Provider Notes (Signed)
[facility] Provider Note   CSN: [id number] Arrival date & time: 11/02/22  0049     History  Chief Complaint  Patient presents with   Emesis    Sonia Miller is a 27 y.o. female.  The history is provided by the patient.  Emesis Severity:  Moderate Duration:  2 weeks Timing:  Intermittent Quality:  Stomach contents Progression:  Unchanged Chronicity:  New Recent urination:  Normal Context: not post-tussive   Relieved by:  Nothing Worsened by:  Nothing Ineffective treatments:  None tried Associated symptoms: no abdominal pain   Risk factors: no alcohol use   Patient with 2 weeks of emesis and some diarrhea who was seen on 1/25 at urgent care and had urine taken and was told she had a UTI.  She was started on macrobid.  Presents with ongoing symptoms.       Home Medications Prior to Admission medications   Medication Sig Start Date End Date Taking? Authorizing Provider  Prenatal Vit-Fe Fumarate-FA (PRENATAL VITAMIN) 27-0.8 MG TABS Take 1 tablet by mouth daily. 11/02/22  Yes Ramia Sidney, MD  hydrocortisone (ANUSOL-HC) 2.5 % rectal cream Apply rectally 2 times daily 08/22/18   Tasia Catchings, Amy V, PA-C  ibuprofen (ADVIL) 600 MG tablet Take 1 tablet (600 mg total) by mouth every 8 (eight) hours as needed for moderate pain. 08/11/20   Orvan July, NP  levocetirizine (XYZAL) 5 MG tablet Take 1 tablet (5 mg total) by mouth every evening. 01/21/20   Jaynee Eagles, PA-C  Lidocaine 0.5 % GEL 1 application 3-4 times a day. Do not exceed 4 applications a day. 08/22/18   Ok Edwards, PA-C  meclizine (ANTIVERT) 12.5 MG tablet Take 1 tablet (12.5 mg total) by mouth 3 (three) times daily as needed for dizziness. 01/21/20   Jaynee Eagles, PA-C  naproxen (NAPROSYN) 500 MG tablet Take 1 tablet (500 mg total) by mouth 2 (two) times daily. 01/21/20   Jaynee Eagles, PA-C  naproxen (NAPROSYN) 500 MG tablet Take 1 tablet (500 mg total) by mouth 2 (two) times  daily. 01/21/20   Jaynee Eagles, PA-C  ondansetron (ZOFRAN ODT) 4 MG disintegrating tablet Take 1 tablet (4 mg total) by mouth every 8 (eight) hours as needed for nausea or vomiting. 08/11/20   Bast, Tressia Miners A, NP  ondansetron (ZOFRAN) 4 MG tablet Take 1 tablet (4 mg total) by mouth every 8 (eight) hours as needed for nausea or vomiting. 12/04/18   Billie Ruddy, MD  promethazine (PHENERGAN) 25 MG tablet Take 0.5-1 tablets (12.5-25 mg total) by mouth every 6 (six) hours as needed. 08/14/18   Marcille Buffy D, CNM  sertraline (ZOLOFT) 25 MG tablet Take 1 tablet (25 mg total) by mouth daily. Patient taking differently: Take 25 mg by mouth daily. Pt not taking 08/21/18   Billie Ruddy, MD  sertraline (ZOLOFT) 25 MG tablet Take 1 tablet (25 mg total) by mouth daily. Patient taking differently: Take 25 mg by mouth daily. Pt not taking 12/04/18   Billie Ruddy, MD  traMADol (ULTRAM) 50 MG tablet Take 1 tablet (50 mg total) by mouth every 6 (six) hours as needed. 02/21/21   Milton Ferguson, MD      Allergies    Latex    Review of Systems   Review of Systems  Gastrointestinal:  Positive for vomiting. Negative for abdominal pain.    Physical Exam Updated Vital Signs BP 115/77   Pulse 94  Temp 98.4 F (36.9 C) (Oral)   Resp 16   LMP 08/06/2022   SpO2 100%  Physical Exam Vitals and nursing note reviewed.  Constitutional:      General: She is not in acute distress.    Appearance: Normal appearance. She is well-developed.  HENT:     Head: Normocephalic and atraumatic.     Nose: Nose normal.  Eyes:     Pupils: Pupils are equal, round, and reactive to light.  Cardiovascular:     Rate and Rhythm: Normal rate and regular rhythm.  Pulmonary:     Effort: Pulmonary effort is normal. No respiratory distress.     Breath sounds: Normal breath sounds.  Abdominal:     General: Abdomen is flat. Bowel sounds are normal. There is no distension.     Palpations: Abdomen is soft.     Tenderness: There  is no abdominal tenderness. There is no guarding or rebound.  Genitourinary:    Vagina: No vaginal discharge.  Musculoskeletal:        General: Normal range of motion.     Cervical back: Normal range of motion and neck supple.  Skin:    General: Skin is warm and dry.     Capillary Refill: Capillary refill takes less than 2 seconds.     Findings: No erythema or rash.  Neurological:     General: No focal deficit present.     Mental Status: She is alert and oriented to person, place, and time.  Psychiatric:        Mood and Affect: Mood normal.        Behavior: Behavior normal.     ED Results / Procedures / Treatments   Labs (all labs ordered are listed, but only abnormal results are displayed) Results for orders placed or performed during the hospital encounter of 11/02/22  CBC with Differential  Result Value Ref Range   WBC 8.7 4.0 - 10.5 K/uL   RBC 4.49 3.87 - 5.11 MIL/uL   Hemoglobin 14.0 12.0 - 15.0 g/dL   HCT 40.4 36.0 - 46.0 %   MCV 90.0 80.0 - 100.0 fL   MCH 31.2 26.0 - 34.0 pg   MCHC 34.7 30.0 - 36.0 g/dL   RDW 12.4 11.5 - 15.5 %   Platelets 303 150 - 400 K/uL   nRBC 0.0 0.0 - 0.2 %   Neutrophils Relative % 59 %   Neutro Abs 5.1 1.7 - 7.7 K/uL   Lymphocytes Relative 30 %   Lymphs Abs 2.6 0.7 - 4.0 K/uL   Monocytes Relative 7 %   Monocytes Absolute 0.6 0.1 - 1.0 K/uL   Eosinophils Relative 3 %   Eosinophils Absolute 0.3 0.0 - 0.5 K/uL   Basophils Relative 1 %   Basophils Absolute 0.1 0.0 - 0.1 K/uL   Immature Granulocytes 0 %   Abs Immature Granulocytes 0.03 0.00 - 0.07 K/uL  Comprehensive metabolic panel  Result Value Ref Range   Sodium 137 135 - 145 mmol/L   Potassium 3.5 3.5 - 5.1 mmol/L   Chloride 107 98 - 111 mmol/L   CO2 23 22 - 32 mmol/L   Glucose, Bld 95 70 - 99 mg/dL   BUN 11 6 - 20 mg/dL   Creatinine, Ser 0.76 0.44 - 1.00 mg/dL   Calcium 8.9 8.9 - 10.3 mg/dL   Total Protein 7.4 6.5 - 8.1 g/dL   Albumin 4.2 3.5 - 5.0 g/dL   AST 26 15 - 41 U/L  ALT 33 0 - 44 U/L   Alkaline Phosphatase 67 38 - 126 U/L   Total Bilirubin 0.7 0.3 - 1.2 mg/dL   GFR, Estimated >60 >60 mL/min   Anion gap 7 5 - 15  hCG, quantitative, pregnancy  Result Value Ref Range   hCG, Beta Chain, Quant, S 57,659 (H) <5 mIU/mL  I-Stat Beta hCG blood, ED ([facility], [facility], [facility] only)  Result Value Ref Range   I-stat hCG, quantitative >2,000.0 (H) <5 mIU/mL   Comment 3           No results found.   Radiology No results found.  Procedures Procedures    Medications Ordered in ED Medications  ondansetron (ZOFRAN) injection 4 mg (4 mg Intravenous Given 11/02/22 0258)  sodium chloride 0.9 % bolus 500 mL (500 mLs Intravenous New Bag/Given 11/02/22 0258)    ED Course/ Medical Decision Making/ A&P                             Medical Decision Making Patient with UTI with ongoing nausea   Amount and/or Complexity of Data Reviewed External Data Reviewed: notes.    Details: Previous notes reviewed  Labs: ordered.    Details: All labs reviewed: white count normal 8.7,  normal hemoglobin 14, normal platelets 303.  Normal sodium 137, normal potassium normal creatinine.  Istat hcg > 2000, beta hcg 57, 659  Risk OTC drugs. Prescription drug management. Risk Details: Undoubtedly pregnancy is making the patient nauseated.  Patient reports no LMP since November.  No pregnancy was checked on 1/25.  No abdominal pain.  No indication for [place] at this time.  Stable for discharge.  Call your OB for ongoing care.  Strict return.       Final Clinical Impression(s) / ED Diagnoses Final diagnoses:  Nausea and vomiting, unspecified vomiting type  Pregnancy, unspecified gestational age   Return for intractable cough, coughing up blood, fevers > 100.4 unrelieved by medication, shortness of breath, intractable vomiting, chest pain, shortness of breath, weakness, numbness, changes in speech, facial asymmetry, abdominal pain, passing out, Inability to tolerate liquids or food, cough, altered  mental status or any concerns. No signs of systemic illness or infection. The patient is nontoxic-appearing on exam and vital signs are within normal limits.  I have reviewed the triage vital signs and the nursing notes. Pertinent labs & imaging results that were available during my care of the patient were reviewed by me and considered in my medical decision making (see chart for details). After history, exam, and medical workup I feel the patient has been appropriately medically screened and is safe for discharge home. Pertinent diagnoses were discussed with the patient. Patient was given return precautions. Rx / DC Orders ED Discharge Orders          Ordered    Prenatal Vit-Fe Fumarate-FA (PRENATAL VITAMIN) 27-0.8 MG TABS  Daily        11/02/22 0438              Reiley Keisler, MD 11/02/22 8768

## 2023-03-27 ENCOUNTER — Other Ambulatory Visit: Payer: Self-pay

## 2023-03-27 ENCOUNTER — Emergency Department (HOSPITAL_COMMUNITY): Payer: 59

## 2023-03-27 ENCOUNTER — Emergency Department (HOSPITAL_COMMUNITY)
Admission: EM | Admit: 2023-03-27 | Discharge: 2023-03-27 | Disposition: A | Discharge status: Home / Self Care | Payer: 59 | Attending: Emergency Medicine | Admitting: Emergency Medicine

## 2023-03-27 ENCOUNTER — Encounter (HOSPITAL_COMMUNITY): Payer: Self-pay | Admitting: *Deleted

## 2023-03-27 DIAGNOSIS — S60221A Contusion of right hand, initial encounter: Secondary | ICD-10-CM | POA: Insufficient documentation

## 2023-03-27 DIAGNOSIS — S6991XA Unspecified injury of right wrist, hand and finger(s), initial encounter: Secondary | ICD-10-CM | POA: Diagnosis present

## 2023-03-27 DIAGNOSIS — Z91040 Latex allergy status: Secondary | ICD-10-CM | POA: Insufficient documentation

## 2023-03-27 DIAGNOSIS — W2209XA Striking against other stationary object, initial encounter: Secondary | ICD-10-CM | POA: Insufficient documentation

## 2023-03-27 MED ORDER — MELOXICAM 7.5 MG PO TABS: 15.0000 mg | ORAL_TABLET | Freq: Every day | ORAL | 0 refills | Status: DC

## 2023-03-27 NOTE — Discharge Instructions (Signed)
Take the prescribed medication as directed. Follow-up with your doctor if ongoing issues.  Return to the ED for new or worsening symptoms.

## 2023-03-27 NOTE — ED Provider Notes (Signed)
[place] EMERGENCY DEPARTMENT AT [facility] Provider Note   CSN: [id number] Arrival date & time: 03/27/23  0128     History  Chief Complaint  Patient presents with   Hand Injury    Sonia Miller is a 27 y.o. female.  The history is provided by the patient and medical records.  Hand Injury  27 y.o. F here with right hand pain after punching a wall at 5pm.  States worsening pain/swelling.  She denies numbness/tingling.  Right hand dominant.  States no relief with tylenol and motrin at home.  Home Medications Prior to Admission medications   Medication Sig Start Date End Date Taking? Authorizing Provider  meloxicam (MOBIC) 7.5 MG tablet Take 2 tablets (15 mg total) by mouth daily. 03/27/23  Yes Garlon Hatchet, PA-C  hydrocortisone (ANUSOL-HC) 2.5 % rectal cream Apply rectally 2 times daily 08/22/18   Cathie Hoops, Amy V, PA-C  ibuprofen (ADVIL) 600 MG tablet Take 1 tablet (600 mg total) by mouth every 8 (eight) hours as needed for moderate pain. 08/11/20   Janace Aris, NP  levocetirizine (XYZAL) 5 MG tablet Take 1 tablet (5 mg total) by mouth every evening. 01/21/20   [name], PA-C  Lidocaine 0.5 % GEL 1 application 3-4 times a day. Do not exceed 4 applications a day. 08/22/18   Belinda Fisher, PA-C  meclizine (ANTIVERT) 12.5 MG tablet Take 1 tablet (12.5 mg total) by mouth 3 (three) times daily as needed for dizziness. 01/21/20   [name], PA-C  naproxen (NAPROSYN) 500 MG tablet Take 1 tablet (500 mg total) by mouth 2 (two) times daily. 01/21/20   [name], PA-C  naproxen (NAPROSYN) 500 MG tablet Take 1 tablet (500 mg total) by mouth 2 (two) times daily. 01/21/20   [name], PA-C  ondansetron (ZOFRAN ODT) 4 MG disintegrating tablet Take 1 tablet (4 mg total) by mouth every 8 (eight) hours as needed for nausea or vomiting. 08/11/20   Bast, Gloris Manchester A, NP  ondansetron (ZOFRAN) 4 MG tablet Take 1 tablet (4 mg total) by mouth every 8 (eight) hours as needed for nausea or  vomiting. 12/04/18   [name], MD  Prenatal Vit-Fe Fumarate-FA (PRENATAL VITAMIN) 27-0.8 MG TABS Take 1 tablet by mouth daily. 11/02/22   Palumbo, April, MD  promethazine (PHENERGAN) 25 MG tablet Take 0.5-1 tablets (12.5-25 mg total) by mouth every 6 (six) hours as needed. 08/14/18   Thressa Sheller D, CNM  sertraline (ZOLOFT) 25 MG tablet Take 1 tablet (25 mg total) by mouth daily. Patient taking differently: Take 25 mg by mouth daily. Pt not taking 08/21/18   [name], MD  sertraline (ZOLOFT) 25 MG tablet Take 1 tablet (25 mg total) by mouth daily. Patient taking differently: Take 25 mg by mouth daily. Pt not taking 12/04/18   [name], MD  traMADol (ULTRAM) 50 MG tablet Take 1 tablet (50 mg total) by mouth every 6 (six) hours as needed. 02/21/21   [name], MD      Allergies    Latex    Review of Systems   Review of Systems  Musculoskeletal:  Positive for arthralgias.  All other systems reviewed and are negative.   Physical Exam Updated Vital Signs BP (!) 121/90 (BP Location: Left Arm)   Pulse 87   Temp 97.8 F (36.6 C) (Oral)   Resp 17   Ht 5\' 2"  (1.575 m)   Wt 64.9 kg   LMP 02/27/2023  SpO2 100%   BMI 26.17 kg/m   Physical Exam Vitals and nursing note reviewed.  Constitutional:      Appearance: She is well-developed.  HENT:     Head: Normocephalic and atraumatic.  Eyes:     Conjunctiva/sclera: Conjunctivae normal.     Pupils: Pupils are equal, round, and reactive to light.  Cardiovascular:     Rate and Rhythm: Normal rate and regular rhythm.     Heart sounds: Normal heart sounds.  Pulmonary:     Effort: Pulmonary effort is normal.     Breath sounds: Normal breath sounds.  Abdominal:     General: Bowel sounds are normal.     Palpations: Abdomen is soft.  Musculoskeletal:        General: Normal range of motion.     Cervical back: Normal range of motion.     Comments: Mild swelling/bruising over right 3rd MCP joint, there is no acute  deformity, pain elicited with ROM of 3rd digit but able to do so, radial pulse intact, normal sensation to fingers  Skin:    General: Skin is warm and dry.  Neurological:     Mental Status: She is alert and oriented to person, place, and time.     ED Results / Procedures / Treatments   Labs (all labs ordered are listed, but only abnormal results are displayed) Labs Reviewed - No data to display  EKG None  Radiology DG Hand Complete Right  Result Date: 03/27/2023 CLINICAL DATA:  Right hand injury, pain EXAM: RIGHT HAND - COMPLETE 3+ VIEW COMPARISON:  None Available. FINDINGS: There is no evidence of fracture or dislocation. There is no evidence of arthropathy or other focal bone abnormality. Mild soft tissue swelling dorsal to the third metacarpal head. IMPRESSION: 1. Soft tissue swelling. No fracture or dislocation. Electronically Signed   By: Helyn Numbers M.D.   On: 03/27/2023 02:52    Procedures .Ortho Injury Treatment  Date/Time: 03/27/2023 3:18 AM  Performed by: Garlon Hatchet, PA-C Authorized by: Garlon Hatchet, PA-C   Consent:    Consent obtained:  Verbal   Consent given by:  Patient   Risks discussed:  Stiffness   Alternatives discussed:  No treatmentInjury location: hand Location details: right hand Injury type: soft tissue Pre-procedure neurovascular assessment: neurovascularly intact  Anesthesia: Local anesthesia used: no  Patient sedated: NoImmobilization: brace Splint type: thumb spica Splint Applied by: ED Provider Supplies used: elastic bandage Post-procedure neurovascular assessment: post-procedure neurovascularly intact       Medications Ordered in ED Medications - No data to display  ED Course/ Medical Decision Making/ A&P                             Medical Decision Making Amount and/or Complexity of Data Reviewed Radiology: ordered.  Risk Prescription drug management.   27 year old female here with right hand pain after punching a  wall out of anger today around 5 PM.  She does have some swelling and bruising over the right third MCP joint.  There is no acute deformity.  Her hand is neurovascularly intact.  X-rays negative for any acute bony findings.  Ace wrap was applied for comfort.  Plan discharge home with anti-inflammatories.  She can follow-up with her doctor for any ongoing issues.  Return here for new concerns.  Final Clinical Impression(s) / ED Diagnoses Final diagnoses:  Contusion of right hand, initial encounter    Rx / DC  Orders ED Discharge Orders          Ordered    meloxicam (MOBIC) 7.5 MG tablet  Daily        03/27/23 0318              Garlon Hatchet, PA-C 03/27/23 0322    Sabas Sous, MD 03/27/23 [phone]

## 2023-03-27 NOTE — ED Triage Notes (Signed)
The pt struck a wall with her rt hand 1700 the hand is painful and swollen  lmp may 30th

## 2024-03-30 ENCOUNTER — Emergency Department (HOSPITAL_COMMUNITY)

## 2024-03-30 ENCOUNTER — Encounter (HOSPITAL_COMMUNITY): Payer: Self-pay | Admitting: Emergency Medicine

## 2024-03-30 ENCOUNTER — Other Ambulatory Visit: Payer: Self-pay

## 2024-03-30 ENCOUNTER — Emergency Department (HOSPITAL_COMMUNITY)
Admission: EM | Admit: 2024-03-30 | Discharge: 2024-03-30 | Disposition: A | Discharge status: Home / Self Care | Attending: Emergency Medicine | Admitting: Emergency Medicine

## 2024-03-30 DIAGNOSIS — R1110 Vomiting, unspecified: Secondary | ICD-10-CM | POA: Insufficient documentation

## 2024-03-30 DIAGNOSIS — R519 Headache, unspecified: Secondary | ICD-10-CM | POA: Insufficient documentation

## 2024-03-30 LAB — CBC WITH DIFFERENTIAL/PLATELET
Abs Immature Granulocytes: 0.01 10*3/uL (ref 0.00–0.07)
Basophils Absolute: 0 10*3/uL (ref 0.0–0.1)
Basophils Relative: 1 %
Eosinophils Absolute: 0.2 10*3/uL (ref 0.0–0.5)
Eosinophils Relative: 2 %
HCT: 38.7 % (ref 36.0–46.0)
Hemoglobin: 12.8 g/dL (ref 12.0–15.0)
Immature Granulocytes: 0 %
Lymphocytes Relative: 45 %
Lymphs Abs: 3.8 10*3/uL (ref 0.7–4.0)
MCH: 30.5 pg (ref 26.0–34.0)
MCHC: 33.1 g/dL (ref 30.0–36.0)
MCV: 92.4 fL (ref 80.0–100.0)
Monocytes Absolute: 0.5 10*3/uL (ref 0.1–1.0)
Monocytes Relative: 6 %
Neutro Abs: 3.9 10*3/uL (ref 1.7–7.7)
Neutrophils Relative %: 46 %
Platelets: 309 10*3/uL (ref 150–400)
RBC: 4.19 MIL/uL (ref 3.87–5.11)
RDW: 12.7 % (ref 11.5–15.5)
WBC: 8.4 10*3/uL (ref 4.0–10.5)
nRBC: 0 % (ref 0.0–0.2)

## 2024-03-30 LAB — BASIC METABOLIC PANEL WITH GFR
Anion gap: 11 (ref 5–15)
BUN: 18 mg/dL (ref 6–20)
CO2: 23 mmol/L (ref 22–32)
Calcium: 9 mg/dL (ref 8.9–10.3)
Chloride: 106 mmol/L (ref 98–111)
Creatinine, Ser: 0.46 mg/dL (ref 0.44–1.00)
GFR, Estimated: >60 mL/min (ref >60)
Glucose, Bld: 108 mg/dL — ABNORMAL HIGH (ref 70–99)
Potassium: 3.4 mmol/L — ABNORMAL LOW (ref 3.5–5.1)
Sodium: 140 mmol/L (ref 135–145)

## 2024-03-30 MED ORDER — ONDANSETRON HCL 4 MG/2ML IJ SOLN
4.0000 mg | Freq: Once | INTRAMUSCULAR | Status: DC
  Filled 2024-03-30: qty 2

## 2024-03-30 MED ORDER — DIPHENHYDRAMINE HCL 50 MG/ML IJ SOLN
50.0000 mg | Freq: Once | INTRAMUSCULAR | Status: AC
  Administered 2024-03-30: 50 mg via INTRAVENOUS
  Filled 2024-03-30: qty 1

## 2024-03-30 MED ORDER — PROCHLORPERAZINE EDISYLATE 10 MG/2ML IJ SOLN
10.0000 mg | Freq: Once | INTRAMUSCULAR | Status: AC
  Administered 2024-03-30: 10 mg via INTRAVENOUS
  Filled 2024-03-30: qty 2

## 2024-03-30 MED ORDER — LACTATED RINGERS IV BOLUS: 1000.0000 mL | Freq: Once | INTRAVENOUS | Status: DC

## 2024-03-30 MED ORDER — DEXAMETHASONE SODIUM PHOSPHATE 10 MG/ML IJ SOLN
10.0000 mg | Freq: Once | INTRAMUSCULAR | Status: DC
  Filled 2024-03-30: qty 1

## 2024-03-30 MED ORDER — ONDANSETRON HCL 4 MG PO TABS: 4.0000 mg | ORAL_TABLET | Freq: Four times a day (QID) | ORAL | 0 refills | Status: AC

## 2024-03-30 MED ORDER — KETOROLAC TROMETHAMINE 15 MG/ML IJ SOLN
15.0000 mg | Freq: Once | INTRAMUSCULAR | Status: DC
  Filled 2024-03-30: qty 1

## 2024-03-30 NOTE — ED Provider Notes (Signed)
 [place] EMERGENCY DEPARTMENT AT [facility] Provider Note   CSN: [id number] Arrival date & time: 03/30/24  0049     History Chief Complaint  Patient presents with   Emesis   Headache    HPI Sonia Miller is a 28 y.o. female presenting for chief complaint of daily headaches since May. Left sided retrorbital in nature. History of migraines in the past but typically resolves Excedrin PRN  Patient's recorded medical, surgical, social, medication list and allergies were reviewed in the Snapshot window as part of the initial history.   Review of Systems   Review of Systems  Constitutional:  Negative for chills and fever.  HENT:  Negative for ear pain and sore throat.   Eyes:  Negative for pain and visual disturbance.  Respiratory:  Negative for cough and shortness of breath.   Cardiovascular:  Negative for chest pain and palpitations.  Gastrointestinal:  Negative for abdominal pain and vomiting.  Genitourinary:  Negative for dysuria and hematuria.  Musculoskeletal:  Negative for arthralgias and back pain.  Skin:  Negative for color change and rash.  Neurological:  Positive for dizziness and headaches. Negative for seizures and syncope.  All other systems reviewed and are negative.   Physical Exam Updated Vital Signs BP (!) 130/102   Pulse 89   Temp 98 F (36.7 C) (Oral)   Resp 20   SpO2 99%  Physical Exam Vitals and nursing note reviewed.  Constitutional:      General: She is not in acute distress.    Appearance: She is well-developed.  HENT:     Head: Normocephalic and atraumatic.   Eyes:     Conjunctiva/sclera: Conjunctivae normal.    Cardiovascular:     Rate and Rhythm: Normal rate and regular rhythm.     Heart sounds: No murmur heard. Pulmonary:     Effort: Pulmonary effort is normal. No respiratory distress.     Breath sounds: Normal breath sounds.  Abdominal:     General: There is no distension.     Palpations: Abdomen is soft.      Tenderness: There is no abdominal tenderness. There is no right CVA tenderness or left CVA tenderness.   Musculoskeletal:        General: No swelling or tenderness. Normal range of motion.     Cervical back: Neck supple.   Skin:    General: Skin is warm and dry.   Neurological:     General: No focal deficit present.     Mental Status: She is alert and oriented to person, place, and time. Mental status is at baseline.     Cranial Nerves: No cranial nerve deficit.      ED Course/ Medical Decision Making/ A&P    Procedures Procedures   Medications Ordered in ED Medications  lactated ringers  bolus 1,000 mL (1,000 mLs Intravenous Patient Refused/Not Given 03/30/24 0337)  ondansetron  (ZOFRAN ) injection 4 mg (4 mg Intravenous Patient Refused/Not Given 03/30/24 0338)  dexamethasone (DECADRON) injection 10 mg (10 mg Intravenous Patient Refused/Not Given 03/30/24 0338)  ketorolac  (TORADOL ) 15 MG/ML injection 15 mg (15 mg Intravenous Patient Refused/Not Given 03/30/24 0339)  prochlorperazine (COMPAZINE) injection 10 mg (10 mg Intravenous Given 03/30/24 0224)  diphenhydrAMINE (BENADRYL) injection 50 mg (50 mg Intravenous Given 03/30/24 0224)   Medical Decision Making:   Sonia Miller is a 28 y.o. female who presented to the ED today with headaches detailed above.    Patient placed on continuous vitals and telemetry monitoring  while in ED which was reviewed periodically.   Complete initial physical exam performed, notably the patient  was HDS in NAD.    Reviewed and confirmed nursing documentation for past medical history, family history, social history.    Initial Assessment:   With the patient's presentation of headache, most likely diagnosis is tension type headaches vs atypical migraines. Other diagnoses were considered including (but not limited to) intracranial mass, intracranial hemorrhage, intracranial infection including meningitis vs encephalitis, GCA, trigeminal neuralgia.  These are considered less likely due to history of present illness and physical exam findings.    This is most consistent with an acute life/limb threatening illness complicated by underlying chronic conditions.   Timeline and slow onset is not consistent with SAH/ICH   Age and description of pain is not consistent with GCA   Lack of fever,meningismus is not consistent with meningitis/encephalitis   Initial Plan:  Will initiate treatment with Compazine/Benardryll/NSAIDS/Tylenol  for treatment of nonspecific headache  Screening labs including CBC and Metabolic panel to evaluate for infectious or metabolic etiology of disease.  Urinalysis with reflex culture ordered to evaluate for UTI or relevant urologic/nephrologic pathology.  CTH to evaluate for structural IC etiology  Objective evaluation as below reviewed   Initial Study Results:   Laboratory  All laboratory results reviewed without evidence of clinically relevant pathology.     EKG EKG was reviewed independently. Rate, rhythm, axis, intervals all examined and without medically relevant abnormality. ST segments without concerns for elevations.    Radiology:  All images reviewed independently. Agree with radiology report at this time.   CT HEAD WO CONTRAST ( )  Final Result        Final Assessment and Plan:   Patient stated her headache was improving during my evaluation. I received a message from that patient had stated complete resolution and that she wanted to go home immediately.    Disposition:  I have considered need for hospitalization, however, considering all of the above, I believe this patient is stable for discharge at this time.  Patient/family educated about specific return precautions for given chief complaint and symptoms.  Patient/family educated about follow-up with PCP/neurology.     Patient/family expressed understanding of return precautions and need for follow-up. Patient spoken to regarding all  imaging and laboratory results and appropriate follow up for these results. All education provided in verbal form with additional information in written form. Time was allowed for answering of patient questions. Patient discharged.    Emergency Department Medication Summary:   Medications  lactated ringers  bolus 1,000 mL (1,000 mLs Intravenous Patient Refused/Not Given 03/30/24 0337)  ondansetron  (ZOFRAN ) injection 4 mg (4 mg Intravenous Patient Refused/Not Given 03/30/24 0338)  dexamethasone (DECADRON) injection 10 mg (10 mg Intravenous Patient Refused/Not Given 03/30/24 0338)  ketorolac  (TORADOL ) 15 MG/ML injection 15 mg (15 mg Intravenous Patient Refused/Not Given 03/30/24 0339)  prochlorperazine (COMPAZINE) injection 10 mg (10 mg Intravenous Given 03/30/24 0224)  diphenhydrAMINE (BENADRYL) injection 50 mg (50 mg Intravenous Given 03/30/24 0224)           Clinical Impression:  1. Acute nonintractable headache, unspecified headache type      Discharge    Clinical Impression:  1. Acute nonintractable headache, unspecified headache type      Discharge   Final Clinical Impression(s) / ED Diagnoses Final diagnoses:  Acute nonintractable headache, unspecified headache type    Rx / DC Orders ED Discharge Orders          Ordered  ondansetron  (ZOFRAN ) 4 MG tablet  Every 6 hours        03/30/24 0331              Jerral Meth, MD 03/30/24 [phone]

## 2024-03-30 NOTE — ED Triage Notes (Signed)
 Pt reports waking every day since May of this year with severe headache across her forehead.  Pt reports it has gotten progressively worse since then to the point she occasionally feels dizzy with it. Yesterday began vomiting about 10 am and has had n/v since then. Vomiting at time of triage. No diarrhea.  No fevers.

## 2024-03-30 NOTE — ED Notes (Signed)
 PT refused to allow nurse to take discharge vitals

## 2024-03-30 NOTE — ED Notes (Signed)
 Patient in room vomiting at this time.

## 2024-05-25 NOTE — Progress Notes (Signed)
 Subjective:    Sonia Miller is a 28 y.o. (DOB August 01, 1996) female.     Patient presents with  . Migraine    Pt presents today with c/o migraine x3 days. Pt reports missing work today      28 YO FEMALE WITH 3 DAYS OF 'MIGRAINE HEADACHE.   SHE HAS BECOME NAUSEUS AND LIGHT BOTHERS HER EYES.      SHE HAS A PMHX OF THE MIGRAINE HA'S.     THIS ONE HAS NOT BEEN HELPED WITH TYLENOL     Migraine Headache pattern:  Some headache always there, and the pain doesn't change much Providers seen:  Primary care provider Time of day symptoms are worse:  No specific time of day Days of the week symptoms are worse:  No specific day of the week Season symptoms are worse:  No particular season Quality:  Unable to describe Laterality:  Unable to describe Location:  Unable to describe Duration:  3 days Headaches last more than three days?: Yes      Reviewed and updated this visit by provider: None       Review of Systems  Constitutional: Negative.   HENT: Negative.    Eyes: Negative.   Respiratory: Negative.    Cardiovascular: Negative.   Gastrointestinal:  Positive for nausea.  Genitourinary: Negative.   Musculoskeletal: Negative.   Skin: Negative.   Neurological:  Positive for headaches.  Psychiatric/Behavioral: Negative.        Objective:   Vitals:   05/25/24 1423  BP: 122/83  Pulse: 73  Temp: 97.8 F (36.6 C)  TempSrc: Tympanic  Resp: 16  Height: 5' 4 (1.626 m)  Weight: 160 lb (72.6 kg)  SpO2: 99%  BMI (Calculated): 27.5   Physical Exam Vitals and nursing note reviewed.  Constitutional:      Appearance: Normal appearance.  HENT:     Head: Normocephalic and atraumatic.     Right Ear: External ear normal.     Nose: Nose normal.     Mouth/Throat:     Mouth: Mucous membranes are moist.     Pharynx: Oropharynx is clear.  Eyes:     Extraocular Movements: Extraocular movements intact.     Conjunctiva/sclera: Conjunctivae normal.  Cardiovascular:     Rate and  Rhythm: Normal rate and regular rhythm.     Pulses: Normal pulses.     Heart sounds: Normal heart sounds. No murmur heard.    No friction rub. No gallop.  Musculoskeletal:        General: Normal range of motion.     Cervical back: Normal range of motion and neck supple.  Pulmonary:     Effort: Pulmonary effort is normal. No respiratory distress.     Breath sounds: Normal breath sounds. No stridor. No wheezing, rhonchi or rales.  Chest:     Chest wall: No tenderness.  Abdominal:     Tenderness: There is no right CVA tenderness or left CVA tenderness.  Skin:    General: Skin is warm and dry.  Neurological:     General: No focal deficit present.     Mental Status: She is alert and oriented to person, place, and time.  Psychiatric:        Mood and Affect: Mood normal.        Behavior: Behavior normal.        Assessment / Plan:   Assessment 1. Migraine without aura and without status migrainosus, not intractable (Primary)    Plan  -  SHE WILL PICK UP RX AND TAKE AS DIRECTD   Risks, benefits, and alternatives of the medications and treatment plan prescribed today were discussed, and patient expressed understanding. Plan follow-up as discussed or as needed if any worsening symptoms or change in condition.

## 2024-07-06 ENCOUNTER — Ambulatory Visit (INDEPENDENT_AMBULATORY_CARE_PROVIDER_SITE_OTHER)

## 2024-07-06 ENCOUNTER — Other Ambulatory Visit (HOSPITAL_COMMUNITY)
Admission: RE | Admit: 2024-07-06 | Discharge: 2024-07-06 | Disposition: A | Discharge status: Home / Self Care | Source: Ambulatory Visit

## 2024-07-06 DIAGNOSIS — N898 Other specified noninflammatory disorders of vagina: Secondary | ICD-10-CM

## 2024-07-06 DIAGNOSIS — F32A Depression, unspecified: Secondary | ICD-10-CM

## 2024-07-06 DIAGNOSIS — F419 Anxiety disorder, unspecified: Secondary | ICD-10-CM | POA: Diagnosis not present

## 2024-07-06 DIAGNOSIS — Z01419 Encounter for gynecological examination (general) (routine) without abnormal findings: Secondary | ICD-10-CM

## 2024-07-06 DIAGNOSIS — N926 Irregular menstruation, unspecified: Secondary | ICD-10-CM | POA: Diagnosis not present

## 2024-07-06 DIAGNOSIS — Z124 Encounter for screening for malignant neoplasm of cervix: Secondary | ICD-10-CM | POA: Insufficient documentation

## 2024-07-06 DIAGNOSIS — Z1239 Encounter for other screening for malignant neoplasm of breast: Secondary | ICD-10-CM

## 2024-07-06 DIAGNOSIS — Z113 Encounter for screening for infections with a predominantly sexual mode of transmission: Secondary | ICD-10-CM

## 2024-07-06 LAB — POCT URINE PREGNANCY: Preg Test, Ur: NEGATIVE

## 2024-07-06 NOTE — Patient Instructions (Addendum)
 [facility]  AREA FAMILY PRACTICE PHYSICIANS  Central/[place]outheast [place] ([id number]) [facility] [address] [place], [place] [address] [phone] Mon-Fri 8:30-12:30, 1:30-5:00 Accepting [facility] Family Medicine at [facility] [address], [place], [place] [address] [phone] Mon-Fri 8:00-5:30 [facility] [address] [place], [place] [address] [phone] Pablo Debar, Thur, Fri 8:30-5:00, Wed 10:00-7:00 (closed 1-2pm) Accepting [facility] [address], [place], [place]  [address] Phone - [phone]   Fax - [phone]  [place] [phone]) [facility] Medicine [address] [place], [place] [address] [phone] Mon-Fri 8:00-5:00 [facility] Pediatrics at [facility])  [address] [place], [place] [address] [phone] Mon-Fri 8:30-5:30, Sat (Oct.-Mar.) 9:00-1:00 Accepting Medicaid  [place] [phone]) [facility] at [facility] [address] [place], [place] [address] [phone] Mon-Fri 8:00-5:00  [place] [phone]) [facility] Medicine at [facility] [address], [place], [place] [address] [phone] Mon-Fri 8:00-5:00 [facility] [place] [address] [place], [place], [place] [address] [phone] Mon-Fri 8:00-5:00 [facility] [address] [place], [place] [address] [phone] Mon-Fri 8:00-5:00 [facility] [address] [place] [address] [phone] Mon-Fri 7:30-5:30  [place] [phone] & [phone]) [facility] [address], [place], [place] [address] [phone] Mon-Thur 8:00-6:00 Accepting [facility] Medicine [address] [place], [place] [address] [phone] Mon-Thur 7:30-7:30, Fri  7:30-4:30 Accepting [facility] Family Medicine at [facility] [address], [place], [place]  [address] [phone]   Fax - [phone]  [place] [phone] & [phone]) Adult nurse HealthCare at [facility] [address] [place], [place] [address] [phone] Mon-Fri 7:00-5:00 [facility] Family Medicine [address], [place], [place] [address] [phone] Mon-Fri 8:00-5:00 Accepting Medicaid [facility] Family [facility] - [facility] [address] [place], [place], [place] [address] [phone] Mon-Fri 8:00-5:00 Accepting Medicaid  [place] [id number]) [facility] Primary Care at [facility] [address] [place], [place] [address] [phone] Mon-Fri 8:00-5:00 [facility] Family Medicine - [facility] Family Medicine at [facility]) [address], [place], [place] [address] [phone] Mon-Fri 8:00-5:00 Accepting Medicaid [facility] - [facility]) [address], [place], [place] [address] [phone] Mon-Fri 8:00-5:30, Sat&Sun by appointment (phones open at 8:30) Accepting [facility] [phone] & [phone]) [facility] [address], [place], [place] [address] [phone] Mon-Thur 8:00-7:00, Fri 8:00-5:00, Sat 8:00-12:00, Sun 9:00-12:00 Accepting Medicaid [facility] - Family Medicine at [facility] [address] [place], [place] [address] [phone] Mon-Thur 8:00-5:00 Accepting Medicaid [facility] - Family Medicine at [place] [address] [place], [place] [address] [phone] Mon-Fri 8:00-5:30, Sat (Oct.-Mar.) 9:00-1:00 Accepting [facility]  [place] [phone]) [facility] [address] [place], [place] [address] [phone] Mon-Fri 8:00-5:00 Accepting [facility]   [place] [phone]) [place]  Family Medicine at [facility] [address], [place], [place] [address] [phone] Mon-Fri 8:00-5:00 [facility] [address] [place], [place] [address] [phone] Mon-Fri 8:00-5:00 [facility] Pediatrics - [place] [address] [facility] [address], [place], [place] [address] [phone] Mon-Fri 8:00-5:00 After hours clinic [facility] [place], [place] [address]) [phone] Mon-Fri 5:00-8:00, Sat 12:00-6:00, Sun 10:00-4:00 Accepting Medicaid [facility] at  [facility] [address], [place], [place]  [address] [phone]   Fax - [phone]  [place] [phone]) Adult nurse HealthCare at [facility] [address] [place], [place], [place] [address] [phone] Mon-Fri 8:00-5:00 [facility] Family Medicine - [place] [facility] Family [facility] at [place]) [address] [place], [place] [address] [phone] Mon-Thur 8:00-7:00, Fri 8:00-5:00, Sat 8:00-12:00
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
## 2024-07-07 LAB — RPR+HBSAG+HCVAB+...
HIV Screen 4th Generation wRfx: NONREACTIVE
Hep C Virus Ab: NONREACTIVE
Hepatitis B Surface Ag: NEGATIVE
RPR Ser Ql: NONREACTIVE

## 2024-07-07 LAB — BETA HCG QUANT (REF LAB): hCG Quant: <1 m[IU]/mL

## 2024-07-08 LAB — CERVICOVAGINAL ANCILLARY ONLY
Bacterial Vaginitis (gardnerella): NEGATIVE
Candida Glabrata: NEGATIVE
Candida Vaginitis: NEGATIVE
Chlamydia: NEGATIVE
Comment: NEGATIVE
Comment: NEGATIVE
Comment: NEGATIVE
Comment: NEGATIVE
Comment: NEGATIVE
Comment: NORMAL
Neisseria Gonorrhea: NEGATIVE
Trichomonas: NEGATIVE

## 2024-07-12 LAB — CYTOLOGY - PAP: Diagnosis: NEGATIVE

## 2024-07-13 ENCOUNTER — Ambulatory Visit: Payer: Self-pay

## 2024-07-22 ENCOUNTER — Ambulatory Visit (INDEPENDENT_AMBULATORY_CARE_PROVIDER_SITE_OTHER): Payer: Self-pay | Admitting: Licensed Clinical Social Worker

## 2024-07-22 DIAGNOSIS — F4323 Adjustment disorder with mixed anxiety and depressed mood: Secondary | ICD-10-CM

## 2024-07-22 NOTE — BH Specialist Note (Signed)
 Integrated Behavioral Health via Telemedicine Visit  07/28/2024 Sonia Miller [id number]  Number of Integrated Behavioral Health Clinician visits: 1- Initial Visit  Session Start time: 1515   Session End time: 1616  Total time in minutes: 61    Referring Provider: Missy Duncans, CNM Patient/Family location: Home [facility] Provider location: Remote Office All persons participating in visit: Patient and [facility] Types of Service: Individual psychotherapy and Video visit  I connected with Sonia Miller and/or Sonia Miller's patient via  Telephone or Video Enabled Telemedicine Application  (Video is Caregility application) and verified that I am speaking with the correct person using two identifiers. Discussed confidentiality: Yes   I discussed the limitations of telemedicine and the availability of in person appointments.  Discussed there is a possibility of technology failure and discussed alternative modes of communication if that failure occurs.  I discussed that engaging in this telemedicine visit, they consent to the provision of behavioral healthcare and the services will be billed under their insurance.  Patient and/or legal guardian expressed understanding and consented to Telemedicine visit: Yes   Presenting Concerns: Patient and/or family reports the following symptoms/concerns: Increased depression and anxiety symptoms.  Duration of problem: Months; Severity of problem: moderate  Patient and/or Family's Strengths/Protective Factors: Concrete supports in place (healthy food, safe environments, etc.) and Physical Health (exercise, healthy diet, medication compliance, etc.)  Goals Addressed: Patient will:  Reduce symptoms of: anxiety and depression   Increase knowledge and/or ability of: coping skills, healthy habits, and self-management skills   Demonstrate ability to: Increase healthy adjustment to current life circumstances and Increase adequate  support systems for patient/family  Progress towards Goals: Ongoing    Interventions: Interventions utilized:  Mindfulness or Management Consultant, Supportive Counseling, Psychoeducation and/or Health Education, and Supportive Reflection Standardized Assessments completed: Not Needed    Patient and/or Family Response: Patient was present for today's session and reported ongoing struggles with anxiety, difficulty focusing, and disrupted sleep due to racing thoughts and an inability to "shut off" her mind. She shared a history of severe depression and anxiety and expressed concern that she may also have ADHD. Patient stated that while exercise, reading, and watching movies provide temporary relief, her anxiety symptoms consistently return. She discussed her past discomfort with therapy and reluctance to take medication but acknowledged an increased openness to treatment. During the session, patient processed emotions related to learning of her adoption at age 4 and the subsequent loss of her biological mother, which she identified as contributing to heightened anxiety and sadness. She reflected on the potential benefits and drawbacks of medication management and expressed interest in initiating Zoloft  at a low dose to help manage her symptoms.   Clinical Assessment/Diagnosis  Adjustment disorder with mixed anxiety and depressed mood    Assessment: Patient currently experiencing persistent anxiety, racing thoughts, difficulty sleeping, and concentration challenges that interfere with her daily functioning. She reports emotional distress connected to unresolved grief and identity issues stemming from her adoption and her biological mother's death. .   Patient may benefit from continued support of integrated behavioral health services.  Plan: Follow up with behavioral health clinician on : 08/05/2024 Behavioral recommendations: Patient to continue therapy to address anxiety management, grief  processing, and emotional regulation while also collaborating with a medical provider to evaluate medication options such as Zoloft . Incorporating consistent coping strategies, mindfulness, and sleep hygiene practices is advised to help reduce anxiety and improve focus. Referral(s): Integrated [employer] (In Clinic)  I discussed the assessment  and treatment plan with the patient and/or parent/guardian. They were provided an opportunity to ask questions and all were answered. They agreed with the plan and demonstrated an understanding of the instructions.   They were advised to call back or seek an in-person evaluation if the symptoms worsen or if the condition fails to improve as anticipated.  [name] LITTIE Seats, LCSWA

## 2024-07-24 ENCOUNTER — Other Ambulatory Visit: Payer: Self-pay

## 2024-07-24 DIAGNOSIS — F419 Anxiety disorder, unspecified: Secondary | ICD-10-CM

## 2024-07-24 MED ORDER — SERTRALINE HCL 25 MG PO TABS: 25.0000 mg | ORAL_TABLET | Freq: Every day | ORAL | 1 refills | Status: AC

## 2024-07-27 ENCOUNTER — Encounter (HOSPITAL_BASED_OUTPATIENT_CLINIC_OR_DEPARTMENT_OTHER): Payer: Self-pay

## 2024-07-27 ENCOUNTER — Other Ambulatory Visit: Payer: Self-pay

## 2024-07-27 ENCOUNTER — Emergency Department (HOSPITAL_BASED_OUTPATIENT_CLINIC_OR_DEPARTMENT_OTHER)
Admission: EM | Admit: 2024-07-27 | Discharge: 2024-07-27 | Disposition: A | Discharge status: Home / Self Care | Attending: Emergency Medicine | Admitting: Emergency Medicine

## 2024-07-27 DIAGNOSIS — R102 Pelvic and perineal pain unspecified side: Secondary | ICD-10-CM

## 2024-07-27 DIAGNOSIS — N760 Acute vaginitis: Secondary | ICD-10-CM | POA: Insufficient documentation

## 2024-07-27 DIAGNOSIS — Z91040 Latex allergy status: Secondary | ICD-10-CM | POA: Insufficient documentation

## 2024-07-27 DIAGNOSIS — B9689 Other specified bacterial agents as the cause of diseases classified elsewhere: Secondary | ICD-10-CM | POA: Diagnosis not present

## 2024-07-27 DIAGNOSIS — N3000 Acute cystitis without hematuria: Secondary | ICD-10-CM | POA: Diagnosis not present

## 2024-07-27 DIAGNOSIS — T7421XA Adult sexual abuse, confirmed, initial encounter: Secondary | ICD-10-CM | POA: Diagnosis present

## 2024-07-27 DIAGNOSIS — Z202 Contact with and (suspected) exposure to infections with a predominantly sexual mode of transmission: Secondary | ICD-10-CM

## 2024-07-27 LAB — URINALYSIS, ROUTINE W REFLEX MICROSCOPIC
Bilirubin Urine: NEGATIVE
Glucose, UA: NEGATIVE mg/dL
Ketones, ur: NEGATIVE mg/dL
Nitrite: NEGATIVE
Protein, ur: NEGATIVE mg/dL
Specific Gravity, Urine: 1.018 (ref 1.005–1.030)
pH: 6 (ref 5.0–8.0)

## 2024-07-27 LAB — PREGNANCY, URINE: Preg Test, Ur: NEGATIVE

## 2024-07-27 LAB — WET PREP, GENITAL
Sperm: NONE SEEN
Trich, Wet Prep: NONE SEEN
WBC, Wet Prep HPF POC: >=10 — AB (ref <10)
Yeast Wet Prep HPF POC: NONE SEEN

## 2024-07-27 LAB — HIV ANTIBODY (ROUTINE TESTING W REFLEX): HIV Screen 4th Generation wRfx: NONREACTIVE

## 2024-07-27 MED ORDER — CEFTRIAXONE SODIUM 500 MG IJ SOLR
500.0000 mg | Freq: Once | INTRAMUSCULAR | Status: AC
  Administered 2024-07-27: 500 mg via INTRAMUSCULAR
  Filled 2024-07-27: qty 500

## 2024-07-27 MED ORDER — CEPHALEXIN 250 MG PO CAPS: 250.0000 mg | ORAL_CAPSULE | Freq: Four times a day (QID) | ORAL | 0 refills | Status: AC

## 2024-07-27 MED ORDER — DOXYCYCLINE HYCLATE 100 MG PO CAPS: 100.0000 mg | ORAL_CAPSULE | Freq: Two times a day (BID) | ORAL | 0 refills | Status: AC

## 2024-07-27 MED ORDER — DOXYCYCLINE HYCLATE 100 MG PO TABS
100.0000 mg | ORAL_TABLET | Freq: Once | ORAL | Status: AC
  Administered 2024-07-27: 100 mg via ORAL
  Filled 2024-07-27: qty 1

## 2024-07-27 MED ORDER — METRONIDAZOLE 500 MG PO TABS: 500.0000 mg | ORAL_TABLET | Freq: Two times a day (BID) | ORAL | 0 refills | Status: AC

## 2024-07-27 MED ORDER — METRONIDAZOLE 500 MG PO TABS
500.0000 mg | ORAL_TABLET | Freq: Once | ORAL | Status: AC
  Administered 2024-07-27: 500 mg via ORAL
  Filled 2024-07-27: qty 1

## 2024-07-27 MED ORDER — OXYCODONE-ACETAMINOPHEN 5-325 MG PO TABS
1.0000 | ORAL_TABLET | Freq: Once | ORAL | Status: AC
  Administered 2024-07-27: 1 via ORAL
  Filled 2024-07-27: qty 1

## 2024-07-27 MED ORDER — ULIPRISTAL ACETATE 30 MG PO TABS
30.0000 mg | ORAL_TABLET | Freq: Once | ORAL | Status: AC
  Administered 2024-07-27: 30 mg via ORAL
  Filled 2024-07-27: qty 1

## 2024-07-27 MED ORDER — LIDOCAINE HCL (PF) 1 % IJ SOLN
INTRAMUSCULAR | Status: AC
  Filled 2024-07-27: qty 5
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
## 2024-07-28 LAB — RPR: RPR Ser Ql: NONREACTIVE

## 2024-07-29 LAB — GC/CHLAMYDIA PROBE AMP (~~LOC~~) NOT AT ARMC
Chlamydia: NEGATIVE
Comment: NEGATIVE
Comment: NORMAL
Neisseria Gonorrhea: NEGATIVE

## 2024-08-05 ENCOUNTER — Encounter: Admitting: Licensed Clinical Social Worker

## 2024-08-05 ENCOUNTER — Ambulatory Visit (HOSPITAL_BASED_OUTPATIENT_CLINIC_OR_DEPARTMENT_OTHER)
Admission: RE | Admit: 2024-08-05 | Discharge: 2024-08-05 | Disposition: A | Discharge status: Home / Self Care | Source: Ambulatory Visit

## 2024-08-05 DIAGNOSIS — R102 Pelvic and perineal pain unspecified side: Secondary | ICD-10-CM | POA: Diagnosis present

## 2024-08-07 ENCOUNTER — Ambulatory Visit: Payer: Self-pay

## 2024-08-12 ENCOUNTER — Ambulatory Visit: Admitting: Licensed Clinical Social Worker

## 2024-08-12 DIAGNOSIS — F4323 Adjustment disorder with mixed anxiety and depressed mood: Secondary | ICD-10-CM

## 2024-08-12 NOTE — BH Specialist Note (Signed)
 Integrated Behavioral Health via Telemedicine Visit  08/18/2024 Tiera Mensinger [id number]  Number of Integrated Behavioral Health Clinician visits: 2- Second Visit  Session Start time: 1515   Session End time: 1548  Total time in minutes: 33    Referring Provider: Harlene Duncans, CNM Patient/Family location: Home [facility] Provider location: Remote Office All persons participating in visit: Patient and [facility] Types of Service: Individual psychotherapy and Video visit  I connected with [name] and/or Myrick Elnita Wickard's patient via  Telephone or Video Enabled Telemedicine Application  (Video is Caregility application) and verified that I am speaking with the correct person using two identifiers. Discussed confidentiality: Yes   I discussed the limitations of telemedicine and the availability of in person appointments.  Discussed there is a possibility of technology failure and discussed alternative modes of communication if that failure occurs.  I discussed that engaging in this telemedicine visit, they consent to the provision of behavioral healthcare and the services will be billed under their insurance.  Patient and/or legal guardian expressed understanding and consented to Telemedicine visit: Yes   Presenting Concerns: Patient and/or family reports the following symptoms/concerns: Improvements mood and symptoms.  Duration of problem: Months; Severity of problem: moderate  Patient and/or Family's Strengths/Protective Factors: Social and Emotional competence, Concrete supports in place (healthy food, safe environments, etc.), and Physical Health (exercise, healthy diet, medication compliance, etc.)  Goals Addressed: Patient will:  Reduce symptoms of: anxiety and depression   Increase knowledge and/or ability of: coping skills, healthy habits, and self-management skills   Demonstrate ability to: Increase healthy adjustment to current life circumstances and  Increase adequate support systems for patient/family  Progress towards Goals: Ongoing    Interventions: Interventions utilized:  Mindfulness or Management Consultant, Supportive Counseling, Psychoeducation and/or Health Education, Communication Skills, and Supportive Reflection Standardized Assessments completed: Not Needed    Patient and/or Family Response: The patient was present for today's virtual session and reported that her initial session felt productive, noting that she left feeling seen, heard, and more confident in advocating for herself. She shared that she began taking sertraline  (Zoloft ) on October 28th and has observed increased calmness and reduced reactivity in stressful situations. The patient noted feeling under the weather for several days and has recently decreased her alcohol use; she reported experiencing headaches and nausea and is unsure whether these symptoms are medication-related, illness-related, or connected to reduced alcohol intake. She also reported a significant positive moment in meeting her work goals, describing this as a high point in her week. Additionally, the patient shared that friends have noticed improvements in her mood and emotional responses, which she identified as another positive change.   Clinical Assessment/Diagnosis  Adjustment disorder with mixed anxiety and depressed mood    Assessment: Patient currently experiencing experiencing improved emotional regulation and decreased reactivity since starting sertraline , along with mild physical symptoms such as headaches and nausea that she is unsure are medication-related or due to illness or reduced alcohol use. She is also experiencing increased confidence in self-advocacy, positive mood shifts, and encouragement from recent work successes and supportive feedback from friends..   Patient may benefit from continued support of integrated behavioral health services.  Plan: Follow up with  behavioral health clinician on : 09/02/24 Behavioral recommendations: patient to continue monitoring her physical symptoms while her body adjusts to sertraline  and maintain reduced alcohol use to support mood stability and medication effectiveness. Ongoing therapy will further reinforce her progress in self-advocacy, emotional regulation, and maintaining positive coping strategies.  Referral(s): Integrated [employer] (In Clinic)  I discussed the assessment and treatment plan with the patient and/or parent/guardian. They were provided an opportunity to ask questions and all were answered. They agreed with the plan and demonstrated an understanding of the instructions.   They were advised to call back or seek an in-person evaluation if the symptoms worsen or if the condition fails to improve as anticipated.  Kimon Loewen LITTIE Seats, LCSWA

## 2024-08-29 DIAGNOSIS — R059 Cough, unspecified: Secondary | ICD-10-CM | POA: Diagnosis not present

## 2024-08-29 DIAGNOSIS — Z20822 Contact with and (suspected) exposure to covid-19: Secondary | ICD-10-CM | POA: Diagnosis not present

## 2024-08-29 DIAGNOSIS — J029 Acute pharyngitis, unspecified: Secondary | ICD-10-CM | POA: Diagnosis not present

## 2024-09-02 ENCOUNTER — Encounter: Admitting: Licensed Clinical Social Worker

## 2024-09-09 ENCOUNTER — Ambulatory Visit (INDEPENDENT_AMBULATORY_CARE_PROVIDER_SITE_OTHER): Admitting: Licensed Clinical Social Worker

## 2024-09-09 DIAGNOSIS — F4323 Adjustment disorder with mixed anxiety and depressed mood: Secondary | ICD-10-CM

## 2024-09-09 NOTE — BH Specialist Note (Unsigned)
 Integrated Behavioral Health via Telemedicine Visit  09/14/2024 Sonia Miller [id number]  Number of Integrated Behavioral Health Clinician visits: 3- Third Visit  Session Start time: 1515   Session End time: 1541  Total time in minutes: 26    Referring Provider: Harlene Miller, CNM Patient/Family location: Home [facility] Provider location: Remote Office All persons participating in visit: Patient and Sonia Miller Types of Service: Individual psychotherapy and Video visit  I connected with Sonia Miller and/or Sonia Miller's patient via  Telephone or Video Enabled Telemedicine Application  (Video is Caregility application) and verified that I am speaking with the correct person using two identifiers. Discussed confidentiality: Yes   I discussed the limitations of telemedicine and the availability of in person appointments.  Discussed there is a possibility of technology failure and discussed alternative modes of communication if that failure occurs.  I discussed that engaging in this telemedicine visit, they consent to the provision of behavioral healthcare and the services will be billed under their insurance.  Patient and/or legal guardian expressed understanding and consented to Telemedicine visit: Yes   Presenting Concerns: Patient and/or family reports the following symptoms/concerns: ongoing symptoms, not feeling well.  Duration of problem: Months; Severity of problem: moderate  Patient and/or Family's Strengths/Protective Factors: Social and Emotional competence, Physical Health (exercise, healthy diet, medication compliance, etc.), and Caregiver has knowledge of parenting & child development  Goals Addressed: Patient will:  Reduce symptoms of: anxiety and depression   Increase knowledge and/or ability of: coping skills, healthy habits, and self-management skills   Demonstrate ability to: Increase healthy adjustment to current life circumstances and  Increase adequate support systems for patient/family  Progress towards Goals: Ongoing    Interventions: Interventions utilized:  Supportive Counseling, Psychoeducation and/or Health Education, Communication Skills, and Supportive Reflection Standardized Assessments completed: Not Needed    Patient and/or Family Response: The patient was present for todays virtual session. She processed recent physical health concerns, including symptoms of bronchitis and ongoing fatigue, and reported feeling as though she may be getting sick again. She shared that although she is currently at work, she is not feeling at her best. Due to being unwell over the past few weeks, the patient reported discontinuing her Zoloft  for approximately one week after forgetting to take it while focusing on other medications. She observed a noticeable decline in her mood during this period. The patient reported restarting Zoloft  three days ago and expressed motivation to take the medication more consistently moving forward.   Clinical Assessment/Diagnosis  Adjustment disorder with mixed anxiety and depressed mood    Assessment: Patient currently experiencing low energy, mild mood instability, and residual physical discomfort related to recent illness. She remained engaged, alert, and cooperative throughout the session..   Patient may benefit from continued support of integrated behavioral health services.  Plan: Follow up with behavioral health clinician on : 09/27/2024 Behavioral recommendations: consistent adherence to prescribed medication and monitoring of mood changes as her body readjusts. The patient was also encouraged to prioritize rest, hydration, and self-care and to follow up with her prescribing provider if symptoms persist or worsen. Referral(s): Integrated [employer] (In Clinic)  I discussed the assessment and treatment plan with the patient and/or parent/guardian. They were provided an  opportunity to ask questions and all were answered. They agreed with the plan and demonstrated an understanding of the instructions.   They were advised to call back or seek an in-person evaluation if the symptoms worsen or if the condition fails to  improve as anticipated.  Jaquavis Felmlee LITTIE Seats, LCSWA

## 2024-09-27 ENCOUNTER — Encounter: Payer: Self-pay | Admitting: Licensed Clinical Social Worker

## 2024-10-22 ENCOUNTER — Other Ambulatory Visit: Payer: Self-pay

## 2024-10-22 DIAGNOSIS — R519 Headache, unspecified: Secondary | ICD-10-CM

## 2024-10-28 ENCOUNTER — Other Ambulatory Visit: Payer: Self-pay

## 2024-10-28 ENCOUNTER — Encounter (HOSPITAL_COMMUNITY): Payer: Self-pay | Admitting: Emergency Medicine

## 2024-10-28 ENCOUNTER — Emergency Department (HOSPITAL_COMMUNITY)
Admission: EM | Admit: 2024-10-28 | Discharge: 2024-10-29 | Disposition: A | Discharge status: Home / Self Care | Attending: Emergency Medicine | Admitting: Emergency Medicine

## 2024-10-28 DIAGNOSIS — R509 Fever, unspecified: Secondary | ICD-10-CM | POA: Diagnosis present

## 2024-10-28 DIAGNOSIS — J020 Streptococcal pharyngitis: Secondary | ICD-10-CM | POA: Insufficient documentation

## 2024-10-28 DIAGNOSIS — Z91040 Latex allergy status: Secondary | ICD-10-CM | POA: Insufficient documentation

## 2024-10-28 LAB — RESP PANEL BY RT-PCR (RSV, FLU A&B, COVID)  RVPGX2
Influenza A by PCR: NEGATIVE
Influenza B by PCR: NEGATIVE
Resp Syncytial Virus by PCR: NEGATIVE
SARS Coronavirus 2 by RT PCR: NEGATIVE

## 2024-10-28 LAB — GROUP A STREP BY PCR: Group A Strep by PCR: DETECTED — AB

## 2024-10-28 NOTE — ED Triage Notes (Signed)
 Pt c/o fever, sore throat, body aches, nasal congestion, and nausea since yesterday.

## 2024-10-29 LAB — BASIC METABOLIC PANEL WITH GFR
Anion gap: 10 (ref 5–15)
BUN: 11 mg/dL (ref 6–20)
CO2: 23 mmol/L (ref 22–32)
Calcium: 9 mg/dL (ref 8.9–10.3)
Chloride: 104 mmol/L (ref 98–111)
Creatinine, Ser: 0.63 mg/dL (ref 0.44–1.00)
GFR, Estimated: >60 mL/min
Glucose, Bld: 113 mg/dL — ABNORMAL HIGH (ref 70–99)
Potassium: 3.7 mmol/L (ref 3.5–5.1)
Sodium: 137 mmol/L (ref 135–145)

## 2024-10-29 LAB — CBC
HCT: 37.8 % (ref 36.0–46.0)
Hemoglobin: 13 g/dL (ref 12.0–15.0)
MCH: 31.2 pg (ref 26.0–34.0)
MCHC: 34.4 g/dL (ref 30.0–36.0)
MCV: 90.6 fL (ref 80.0–100.0)
Platelets: 303 10*3/uL (ref 150–400)
RBC: 4.17 MIL/uL (ref 3.87–5.11)
RDW: 13.4 % (ref 11.5–15.5)
WBC: 20.6 10*3/uL — ABNORMAL HIGH (ref 4.0–10.5)
nRBC: 0 % (ref 0.0–0.2)

## 2024-10-29 MED ORDER — ONDANSETRON 8 MG PO TBDP
8.0000 mg | ORAL_TABLET | Freq: Once | ORAL | Status: DC
  Filled 2024-10-29: qty 1

## 2024-10-29 MED ORDER — ONDANSETRON 4 MG PO TBDP: 4.0000 mg | ORAL_TABLET | Freq: Three times a day (TID) | ORAL | 0 refills | Status: AC | PRN

## 2024-10-29 MED ORDER — ACETAMINOPHEN 500 MG PO TABS
1000.0000 mg | ORAL_TABLET | Freq: Once | ORAL | Status: AC
  Administered 2024-10-29: 1000 mg via ORAL
  Filled 2024-10-29: qty 2

## 2024-10-29 MED ORDER — PENICILLIN G BENZATHINE 1200000 UNIT/2ML IM SUSY
1.2000 10*6.[IU] | PREFILLED_SYRINGE | Freq: Once | INTRAMUSCULAR | Status: AC
  Administered 2024-10-29: 1.2 10*6.[IU] via INTRAMUSCULAR
  Filled 2024-10-29: qty 2

## 2024-10-29 MED ORDER — ONDANSETRON HCL 4 MG/2ML IJ SOLN
4.0000 mg | Freq: Once | INTRAMUSCULAR | Status: AC
  Administered 2024-10-29: 4 mg via INTRAVENOUS
  Filled 2024-10-29: qty 2

## 2024-10-29 MED ORDER — SODIUM CHLORIDE 0.9 % IV BOLUS
1000.0000 mL | Freq: Once | INTRAVENOUS | Status: AC
  Administered 2024-10-29: 1000 mL via INTRAVENOUS

## 2024-10-29 MED ORDER — DEXAMETHASONE SOD PHOSPHATE PF 10 MG/ML IJ SOLN: 10.0000 mg | Freq: Once | INTRAMUSCULAR | Status: DC

## 2024-10-29 MED ORDER — DEXAMETHASONE SOD PHOSPHATE PF 10 MG/ML IJ SOLN
10.0000 mg | Freq: Once | INTRAMUSCULAR | Status: AC
  Administered 2024-10-29: 10 mg via INTRAVENOUS
  Filled 2024-10-29: qty 1

## 2024-10-29 NOTE — ED Notes (Signed)
 Patient vomited before administration of tylenol  when writer was drawing up nausea meds. Will wait till nausea meds work and try administering tylenol . PA made aware.

## 2024-10-29 NOTE — ED Provider Notes (Signed)
 " [place] [facility] DEPARTMENT AT [facility] Provider Note   CSN: [id number] Arrival date & time: 10/28/24  2049     Patient presents with: Fever   Sonia Miller is a 29 y.o. female with history of anxiety depression.  Presents to ED complaining of fevers, body aches and chills, sore throat for the last 2 days.  Reports that her godson at home has recently been sick with unknown illness.  She endorses nausea without vomiting.  She denies any abdominal pain or diarrhea.  Denies any chest pain or shortness of breath.  Denies any trouble swallowing.   Fever      Prior to Admission medications  Medication Sig Start Date End Date Taking? Authorizing Provider  ondansetron  (ZOFRAN -ODT) 4 MG disintegrating tablet Take 1 tablet (4 mg total) by mouth every 8 (eight) hours as needed for nausea or vomiting. 10/29/24  Yes Ruthell Lonni FALCON, PA-C  ibuprofen  (ADVIL ) 600 MG tablet Take 1 tablet (600 mg total) by mouth every 8 (eight) hours as needed for moderate pain. Patient not taking: Reported on 07/06/2024 08/11/20   Adah Corning A, FNP  levocetirizine (XYZAL ) 5 MG tablet Take 1 tablet (5 mg total) by mouth every evening. Patient not taking: Reported on 07/06/2024 01/21/20   Donzella Carrol [place], PA-C  meclizine  (ANTIVERT ) 12.5 MG tablet Take 1 tablet (12.5 mg total) by mouth 3 (three) times daily as needed for dizziness. Patient not taking: Reported on 07/06/2024 01/21/20   Aylanie Cubillos [place], PA-C  ondansetron  (ZOFRAN ) 4 MG tablet Take 1 tablet (4 mg total) by mouth every 8 (eight) hours as needed for nausea or vomiting. 12/04/18   Mercer Clotilda SAUNDERS, MD  ondansetron  (ZOFRAN ) 4 MG tablet Take 1 tablet (4 mg total) by mouth every 6 (six) hours. Patient not taking: Reported on 07/06/2024 03/30/24   Jerral Meth, MD  Prenatal Vit-Fe Fumarate-FA (PRENATAL VITAMIN) 27-0.8 MG TABS Take 1 tablet by mouth daily. Patient not taking: Reported on 07/06/2024 11/02/22   Palumbo, April, MD  promethazine   (PHENERGAN ) 25 MG tablet Take 0.5-1 tablets (12.5-25 mg total) by mouth every 6 (six) hours as needed. 08/14/18   Edmundo Moats D, CNM  sertraline  (ZOLOFT ) 25 MG tablet Take 1 tablet (25 mg total) by mouth daily. 07/24/24   Synthia Raisin, CNM  traMADol  (ULTRAM ) 50 MG tablet Take 1 tablet (50 mg total) by mouth every 6 (six) hours as needed. Patient not taking: Reported on 07/06/2024 02/21/21   Zammit, Joseph, MD    Allergies: Latex    Review of Systems  Constitutional:  Positive for fever.  All other systems reviewed and are negative.   Updated Vital Signs BP 117/67   Pulse (!) 112   Temp 99.6 F (37.6 C) (Oral)   Resp (!) 25   Ht 5' 2 (1.575 m)   Wt 72.6 kg   SpO2 97%   BMI 29.26 kg/m   Physical Exam Vitals and nursing note reviewed.  Constitutional:      General: She is not in acute distress.    Appearance: She is well-developed.  HENT:     Head: Normocephalic and atraumatic.     Mouth/Throat:     Pharynx: Oropharyngeal exudate present.     Comments: Midline uvula.  Exudate to posterior oropharynx.  Handling secretions appropriately.  No drooling.  No change to phonation. Eyes:     Conjunctiva/sclera: Conjunctivae normal.  Cardiovascular:     Rate and Rhythm: Normal rate and regular rhythm.  Heart sounds: No murmur heard. Pulmonary:     Effort: Pulmonary effort is normal. No respiratory distress.     Breath sounds: Normal breath sounds.  Abdominal:     Palpations: Abdomen is soft.     Tenderness: There is no abdominal tenderness.  Musculoskeletal:        General: No swelling.     Cervical back: Neck supple.  Skin:    General: Skin is warm and dry.     Capillary Refill: Capillary refill takes less than 2 seconds.  Neurological:     Mental Status: She is alert.  Psychiatric:        Mood and Affect: Mood normal.     (all labs ordered are listed, but only abnormal results are displayed) Labs Reviewed  GROUP A STREP BY PCR - Abnormal; Notable for the  following components:      Result Value   Group A Strep by PCR DETECTED (*)    All other components within normal limits  CBC - Abnormal; Notable for the following components:   WBC 20.6 (*)    All other components within normal limits  BASIC METABOLIC PANEL WITH GFR - Abnormal; Notable for the following components:   Glucose, Bld 113 (*)    All other components within normal limits  RESP PANEL BY RT-PCR (RSV, FLU A&B, COVID)  [id number]    EKG: EKG Interpretation Date/Time:  Friday October 29 2024 01:59:38 EST Ventricular Rate:  112 PR Interval:  155 QRS Duration:  77 QT Interval:  307 QTC Calculation: 419 R Axis:   43  Text Interpretation: Sinus tachycardia Probable left atrial enlargement RSR' in V1 or V2, right VCD or RVH Nonspecific T abnormalities, anterior leads Confirmed by Jerral Meth [phone]) on 10/29/2024 3:41:26 AM  Radiology: No results found.  Procedures   Medications Ordered in the ED  acetaminophen  (TYLENOL ) tablet 1,000 mg (1,000 mg Oral Given 10/29/24 0300)  penicillin  g benzathine (BICILLIN  LA) 1200000 UNIT/2ML injection 1.2 Million Units (1.2 Million Units Intramuscular Given 10/29/24 0206)  sodium chloride  0.9 % bolus 1,000 mL (0 mLs Intravenous Stopped 10/29/24 0309)  dexamethasone  (DECADRON ) injection 10 mg (10 mg Intravenous Given 10/29/24 0205)  ondansetron  (ZOFRAN ) injection 4 mg (4 mg Intravenous Given 10/29/24 0205)     Medical Decision Making Amount and/or Complexity of Data Reviewed Labs: ordered. ECG/medicine tests: ordered.  Risk OTC drugs. Prescription drug management.   29 year old female presents to ED with complaints of fever, sore throat.  On exam, she is afebrile however tachycardic to 126.  Will collect EKG.  Lung sounds are clear bilaterally, no hypoxia.  Abdomen soft and compressible.  Neuroexam at baseline.  Viral panel negative for all.  Group A strep testing is positive.  Treated with 1.2 milliunits Bicillin .  Given Tylenol  for  body aches and chills, Zofran  for nausea, Decadron  for tonsillar swelling.  Patient given liter of fluid for tachycardia which is most likely due to her infectious process.  After medications were given to include Zofran , Decadron , Bicillin , liter of fluid the patient reports she feels much better.  Still tachycardic to 112 however much improved from 145.  Patient discharged home at this time.  She was advised to follow-up outpatient with PCP which she was referred to.  She was advised to take Tylenol  or ibuprofen  at home for body aches and chills.  Prescribed Zofran  for home nausea.  Patient advised to rest comfortably at home, continue to hydrate herself.  Given return precautions and she voiced  understanding.  Stable to discharge.    Final diagnoses:  Strep pharyngitis    ED Discharge Orders          Ordered    ondansetron  (ZOFRAN -ODT) 4 MG disintegrating tablet  Every 8 hours PRN        10/29/24 0122               Peityn Payton F, PA-C 10/29/24 0344    Jerral Meth, MD 10/29/24 0500  "

## 2024-10-29 NOTE — Discharge Instructions (Addendum)
 As we discussed, you have strep throat.  You received a one-time shot here to help treat it.  I am sending you home with Zofran  for nausea which you can take every 6 hours as needed.  Please continue to take Tylenol  or ibuprofen  every 6 hours as needed for body aches and chills and fevers.  Please follow-up with your PCP or return to the ED for further care.  I have referred you to a PCP.
